# Patient Record
Sex: Male | Born: 1960 | Race: Black or African American | Hispanic: No | Marital: Single | Smoking: Never smoker
Health system: Southern US, Community
[De-identification: ages and names within clinical notes are randomized; demographics above are authoritative.]

## PROBLEM LIST (undated history)

## (undated) DIAGNOSIS — R569 Unspecified convulsions: Secondary | ICD-10-CM

## (undated) DIAGNOSIS — I10 Essential (primary) hypertension: Secondary | ICD-10-CM

## (undated) DIAGNOSIS — F1010 Alcohol abuse, uncomplicated: Secondary | ICD-10-CM

## (undated) DIAGNOSIS — E7800 Pure hypercholesterolemia, unspecified: Secondary | ICD-10-CM

## (undated) DIAGNOSIS — E119 Type 2 diabetes mellitus without complications: Secondary | ICD-10-CM

## (undated) HISTORY — PX: CHOLECYSTECTOMY: SHX55

---

## 2000-06-25 ENCOUNTER — Emergency Department (HOSPITAL_COMMUNITY): Admission: EM | Admit: 2000-06-25 | Discharge: 2000-06-25 | Payer: Self-pay | Admitting: Emergency Medicine

## 2010-04-17 ENCOUNTER — Emergency Department (HOSPITAL_BASED_OUTPATIENT_CLINIC_OR_DEPARTMENT_OTHER): Admission: EM | Admit: 2010-04-17 | Discharge: 2010-04-18 | Payer: Self-pay | Admitting: Emergency Medicine

## 2010-11-07 LAB — BASIC METABOLIC PANEL
BUN: 5 mg/dL — ABNORMAL LOW (ref 6–23)
CO2: 32 mEq/L (ref 19–32)
Calcium: 9 mg/dL (ref 8.4–10.5)
Chloride: 104 mEq/L (ref 96–112)
Chloride: 98 mEq/L (ref 96–112)
Creatinine, Ser: 0.8 mg/dL (ref 0.4–1.5)
Creatinine, Ser: 0.9 mg/dL (ref 0.4–1.5)
GFR calc Af Amer: >60 mL/min (ref >60)
GFR calc non Af Amer: >60 mL/min (ref >60)
Glucose, Bld: 428 mg/dL — ABNORMAL HIGH (ref 70–99)
Sodium: 136 mEq/L (ref 135–145)

## 2010-11-07 LAB — GLUCOSE, CAPILLARY
Glucose-Capillary: 171 mg/dL — ABNORMAL HIGH (ref 70–99)
Glucose-Capillary: 295 mg/dL — ABNORMAL HIGH (ref 70–99)
Glucose-Capillary: 392 mg/dL — ABNORMAL HIGH (ref 70–99)
Glucose-Capillary: 482 mg/dL — ABNORMAL HIGH (ref 70–99)

## 2012-03-23 ENCOUNTER — Emergency Department (HOSPITAL_COMMUNITY)
Admission: EM | Admit: 2012-03-23 | Discharge: 2012-03-24 | Disposition: A | Discharge status: Other Acute Inpatient Hospital | Payer: Medicaid Other | Attending: Emergency Medicine | Admitting: Emergency Medicine

## 2012-03-23 ENCOUNTER — Encounter (HOSPITAL_COMMUNITY): Payer: Self-pay

## 2012-03-23 DIAGNOSIS — F1010 Alcohol abuse, uncomplicated: Secondary | ICD-10-CM | POA: Insufficient documentation

## 2012-03-23 DIAGNOSIS — I10 Essential (primary) hypertension: Secondary | ICD-10-CM | POA: Insufficient documentation

## 2012-03-23 DIAGNOSIS — F411 Generalized anxiety disorder: Secondary | ICD-10-CM | POA: Insufficient documentation

## 2012-03-23 HISTORY — DX: Essential (primary) hypertension: I10

## 2012-03-23 HISTORY — DX: Alcohol abuse, uncomplicated: F10.10

## 2012-03-23 HISTORY — DX: Unspecified convulsions: R56.9

## 2012-03-23 HISTORY — DX: Pure hypercholesterolemia, unspecified: E78.00

## 2012-03-23 LAB — COMPREHENSIVE METABOLIC PANEL
ALT: 20 U/L (ref 0–53)
AST: 45 U/L — ABNORMAL HIGH (ref 0–37)
Albumin: 3.3 g/dL — ABNORMAL LOW (ref 3.5–5.2)
Alkaline Phosphatase: 148 U/L — ABNORMAL HIGH (ref 39–117)
CO2: 26 mEq/L (ref 19–32)
Chloride: 100 mEq/L (ref 96–112)
GFR calc non Af Amer: >90 mL/min (ref >90)
Potassium: 3.7 mEq/L (ref 3.5–5.1)
Sodium: 135 mEq/L (ref 135–145)
Total Bilirubin: 0.2 mg/dL — ABNORMAL LOW (ref 0.3–1.2)

## 2012-03-23 LAB — CBC
MCV: 94.4 fL (ref 78.0–100.0)
Platelets: 196 10*3/uL (ref 150–400)
RBC: 3.21 MIL/uL — ABNORMAL LOW (ref 4.22–5.81)
RDW: 16.6 % — ABNORMAL HIGH (ref 11.5–15.5)
WBC: 6.9 10*3/uL (ref 4.0–10.5)

## 2012-03-23 LAB — RAPID URINE DRUG SCREEN, HOSP PERFORMED
Amphetamines: NOT DETECTED
Barbiturates: POSITIVE — AB
Tetrahydrocannabinol: NOT DETECTED

## 2012-03-23 LAB — GLUCOSE, CAPILLARY: Glucose-Capillary: 172 mg/dL — ABNORMAL HIGH (ref 70–99)

## 2012-03-23 MED ORDER — METFORMIN HCL 500 MG PO TABS
1000.0000 mg | ORAL_TABLET | Freq: Every day | ORAL | Status: DC
  Administered 2012-03-23 – 2012-03-24 (×2): 1000 mg via ORAL
  Filled 2012-03-23 (×2): qty 2

## 2012-03-23 MED ORDER — LISINOPRIL 5 MG PO TABS
5.0000 mg | ORAL_TABLET | Freq: Every day | ORAL | Status: DC
  Administered 2012-03-23 – 2012-03-24 (×2): 5 mg via ORAL
  Filled 2012-03-23 (×2): qty 1

## 2012-03-23 MED ORDER — LORAZEPAM 2 MG/ML IJ SOLN: 1.0000 mg | Freq: Four times a day (QID) | INTRAMUSCULAR | Status: DC | PRN

## 2012-03-23 MED ORDER — FOLIC ACID 1 MG PO TABS
1.0000 mg | ORAL_TABLET | Freq: Every day | ORAL | Status: DC
  Administered 2012-03-23 – 2012-03-24 (×2): 1 mg via ORAL
  Filled 2012-03-23 (×2): qty 1

## 2012-03-23 MED ORDER — METFORMIN HCL 500 MG PO TABS
500.0000 mg | ORAL_TABLET | Freq: Every day | ORAL | Status: DC
  Administered 2012-03-24: 500 mg via ORAL
  Filled 2012-03-23 (×2): qty 1

## 2012-03-23 MED ORDER — CARVEDILOL 3.125 MG PO TABS
3.1250 mg | ORAL_TABLET | Freq: Two times a day (BID) | ORAL | Status: DC
  Administered 2012-03-23 – 2012-03-24 (×3): 3.125 mg via ORAL
  Filled 2012-03-23 (×4): qty 1

## 2012-03-23 MED ORDER — NICOTINE 21 MG/24HR TD PT24: 21.0000 mg | MEDICATED_PATCH | Freq: Every day | TRANSDERMAL | Status: DC

## 2012-03-23 MED ORDER — ADULT MULTIVITAMIN W/MINERALS CH
1.0000 | ORAL_TABLET | Freq: Every day | ORAL | Status: DC
  Administered 2012-03-23 – 2012-03-24 (×2): 1 via ORAL
  Filled 2012-03-23 (×2): qty 1

## 2012-03-23 MED ORDER — LORAZEPAM 1 MG PO TABS
1.0000 mg | ORAL_TABLET | Freq: Four times a day (QID) | ORAL | Status: DC | PRN
  Filled 2012-03-23: qty 2

## 2012-03-23 MED ORDER — GLIPIZIDE ER 5 MG PO TB24
5.0000 mg | ORAL_TABLET | Freq: Every day | ORAL | Status: DC
  Administered 2012-03-23 – 2012-03-24 (×2): 5 mg via ORAL
  Filled 2012-03-23 (×2): qty 1

## 2012-03-23 MED ORDER — HYDROXYZINE HCL 25 MG PO TABS: 25.0000 mg | ORAL_TABLET | Freq: Every evening | ORAL | Status: DC | PRN

## 2012-03-23 MED ORDER — VITAMIN B-1 100 MG PO TABS
100.0000 mg | ORAL_TABLET | Freq: Every day | ORAL | Status: DC
  Administered 2012-03-23 – 2012-03-24 (×2): 100 mg via ORAL
  Filled 2012-03-23 (×2): qty 1

## 2012-03-23 MED ORDER — THIAMINE HCL 100 MG/ML IJ SOLN: 100.0000 mg | Freq: Every day | INTRAMUSCULAR | Status: DC

## 2012-03-23 MED ORDER — METFORMIN HCL 500 MG PO TABS: 500.0000 mg | ORAL_TABLET | Freq: Two times a day (BID) | ORAL | Status: DC

## 2012-03-23 MED ORDER — LORAZEPAM 1 MG PO TABS
0.0000 mg | ORAL_TABLET | Freq: Two times a day (BID) | ORAL | Status: DC
  Administered 2012-03-24: 1 mg via ORAL

## 2012-03-23 MED ORDER — LORAZEPAM 1 MG PO TABS
2.0000 mg | ORAL_TABLET | Freq: Once | ORAL | Status: AC
  Administered 2012-03-23: 2 mg via ORAL

## 2012-03-23 MED ORDER — PHENYTOIN SODIUM EXTENDED 100 MG PO CAPS
200.0000 mg | ORAL_CAPSULE | Freq: Two times a day (BID) | ORAL | Status: DC
  Administered 2012-03-23 – 2012-03-24 (×3): 200 mg via ORAL
  Filled 2012-03-23 (×3): qty 2

## 2012-03-23 MED ORDER — ALUM & MAG HYDROXIDE-SIMETH 200-200-20 MG/5ML PO SUSP: 30.0000 mL | ORAL | Status: DC | PRN

## 2012-03-23 MED ORDER — FLUOXETINE HCL 20 MG PO CAPS
20.0000 mg | ORAL_CAPSULE | Freq: Every day | ORAL | Status: DC
  Administered 2012-03-23 – 2012-03-24 (×2): 20 mg via ORAL
  Filled 2012-03-23 (×3): qty 1

## 2012-03-23 MED ORDER — IBUPROFEN 600 MG PO TABS
600.0000 mg | ORAL_TABLET | Freq: Three times a day (TID) | ORAL | Status: DC | PRN
  Administered 2012-03-24: 600 mg via ORAL
  Filled 2012-03-23: qty 1

## 2012-03-23 MED ORDER — LORAZEPAM 1 MG PO TABS
0.0000 mg | ORAL_TABLET | Freq: Four times a day (QID) | ORAL | Status: DC
  Administered 2012-03-24: 1 mg via ORAL
  Filled 2012-03-23: qty 1

## 2012-03-23 MED ORDER — ONDANSETRON HCL 4 MG PO TABS: 4.0000 mg | ORAL_TABLET | Freq: Three times a day (TID) | ORAL | Status: DC | PRN

## 2012-03-23 NOTE — ED Notes (Signed)
Pt states he was at [facility] last week and was then sent to [facility] for alcohol tx.  After being discharged from [facility] he went back home to drink.  He returned to [facility] today for more alcohol treatment but was told to come to [place] for med clearance.  Pt denies SI/HI.

## 2012-03-23 NOTE — BH Assessment (Signed)
Assessment Note   Jerry Small is an 51 y.o. male. Pt reported to the [facility] with a chief complaint of alcohol dependence, requesting detox. Pt reports that he has been drinking alcohol since the age of 5 with his longest period of sobriety being 80yr in 35. Pt states that he drinks a 6-pack of beer daily for the last 7-8 months, with his last use on 03/22/12 ( amt= 6-pack of beer).  Pt reports that his current withdrawal symptoms include: shaking, anxiety, sweating, hot/cold chills and headaches. Pt reports that approximately 2 weeks ago he completed detox at [facility] and had an appointment scheduled at [facility] the following week. Pt states that he was unable to remain sober to make it to his appointment at [facility]. Pt shared that he needs help with alcohol detox and wants to "get off alcohol for good so that he can get a job and a stable place to live". Pt states that he is currently homeless and is staying with a friend. Pt has a hx of seizures, reporting that his last seizure was 3 weeks ago. Pt reports that he has a current prescription for his seizure medications and has a 90 day supply. Pt has no insurance and no Medicaid pending.  Pt denies all SI/HI/AVH and current depression.   Axis I: Alcohol Dependence Axis II: Deferred Axis III:  Past Medical History  Diagnosis Date  . Alcohol abuse   . Seizure   . Hypertension   . High cholesterol    Axis IV: housing problems, occupational problems and other psychosocial or environmental problems Axis V: 41-50 serious symptoms  Past Medical History:  Past Medical History  Diagnosis Date  . Alcohol abuse   . Seizure   . Hypertension   . High cholesterol     Past Surgical History  Procedure Date  . Cholecystectomy     Family History:  Family History  Problem Relation Age of Onset  . Hypertension Father   . Seizures Father     Social History:  reports that he has never smoked. He has never used smokeless tobacco. He  reports that he drinks alcohol. He reports that he does not use illicit drugs.  Additional Social History:  Alcohol / Drug Use History of alcohol / drug use?: Yes Substance #1 Name of Substance 1: Alcohol  1 - Age of First Use: 14 1 - Amount (size/oz): 6-pack beer  1 - Frequency: daily 1 - Duration: 7-8 months 1 - Last Use / Amount: 03/22/12- 6-pack of beer  CIWA: CIWA-Ar BP: 184/97 mmHg Pulse Rate: 67  Nausea and Vomiting: no nausea and no vomiting Tactile Disturbances: none Tremor: no tremor Auditory Disturbances: not present Paroxysmal Sweats: no sweat visible Visual Disturbances: not present Anxiety: no anxiety, at ease Headache, Fullness in Head: none present Agitation: normal activity Orientation and Clouding of Sensorium: oriented and can do serial additions CIWA-Ar Total: 0  COWS:    Allergies: No Known Allergies  Home Medications:  (Not in a hospital admission)  OB/GYN Status:  No LMP for male patient.  General Assessment Data Location of Assessment: [facility] Living Arrangements: Non-relatives/Friends (pt is homeless and currently staying with a friend) Can pt return to current living arrangement?: Yes Admission Status: Voluntary Is patient capable of signing voluntary admission?: Yes Transfer from: Acute Hospital Referral Source: Self/Family/Friend  Education Status Is patient currently in school?: No  Risk to self Suicidal Ideation: No Suicidal Intent: No Is patient at risk for suicide?: No  Suicidal Plan?: No Access to Means: No What has been your use of drugs/alcohol within the last 12 months?: ETOH: 6pack of beer daily Previous Attempts/Gestures: No How many times?: 0  Other Self Harm Risks: none Triggers for Past Attempts: None known Intentional Self Injurious Behavior: None Family Suicide History: No Recent stressful life event(s): Other (Comment) (unemployed and homeless) Persecutory voices/beliefs?: No Depression: No Depression Symptoms:   (negative for all symptoms) Substance abuse history and/or treatment for substance abuse?: Yes Suicide prevention information given to non-admitted patients: Not applicable  Risk to Others Homicidal Ideation: No Thoughts of Harm to Others: No Current Homicidal Intent: No Current Homicidal Plan: No Access to Homicidal Means: No Identified Victim: none History of harm to others?: No Assessment of Violence: None Noted Violent Behavior Description: pt is calm and cooperative during assessment Does patient have access to weapons?: No Criminal Charges Pending?: No Does patient have a court date: No  Psychosis Hallucinations: None noted Delusions: None noted  Mental Status Report Appear/Hygiene: Other (Comment) (appropriate to circumstances) Eye Contact: Fair Motor Activity: Freedom of movement Speech: Logical/coherent Level of Consciousness: Alert Mood: Other (Comment) (appropriate to circumstances) Affect: Appropriate to circumstance Anxiety Level: None Thought Processes: Coherent;Relevant Judgement: Unimpaired Orientation: Person;Place;Time;Situation Obsessive Compulsive Thoughts/Behaviors: None  Cognitive Functioning Concentration: Normal Memory: Recent Intact;Remote Intact IQ: Average Insight: Fair Impulse Control: Fair Appetite: Good Weight Loss: 0  Weight Gain: 0  Sleep: Decreased Total Hours of Sleep: 4  (last 2 nights) Vegetative Symptoms: None  ADLScreening [facility] Assessment Services) Patient's cognitive ability adequate to safely complete daily activities?: Yes Patient able to express need for assistance with ADLs?: Yes Independently performs ADLs?: Yes  Abuse/Neglect [facility]) Physical Abuse: Denies Verbal Abuse: Denies Sexual Abuse: Denies  Prior Inpatient Therapy Prior Inpatient Therapy: Yes Prior Therapy Dates: 02/2012 Prior Therapy Facilty/Provider(s): [facility]  Reason for Treatment: detox  Prior Outpatient Therapy Prior Outpatient  Therapy: Yes Prior Therapy Dates: currently Prior Therapy Facilty/Provider(s): [facility] Reason for Treatment: depression  ADL Screening (condition at time of admission) Patient's cognitive ability adequate to safely complete daily activities?: Yes Patient able to express need for assistance with ADLs?: Yes Independently performs ADLs?: Yes       Abuse/Neglect Assessment (Assessment to be complete while patient is alone) Physical Abuse: Denies Verbal Abuse: Denies Sexual Abuse: Denies Values / Beliefs Cultural Requests During Hospitalization: None Spiritual Requests During Hospitalization: None        Additional Information 1:1 In Past 12 Months?: No CIRT Risk: No Elopement Risk: No Does patient have medical clearance?: Yes     Disposition:  Disposition Disposition of Patient: Referred to [facility]) Patient referred to: [facility]  On Site Evaluation by:   Reviewed with Physician:     Dell Ponto 03/23/2012 6:28 PM

## 2012-03-23 NOTE — ED Notes (Signed)
SW reports that [facility] is requesting a Dilantin level before acceptance. [facility] is requesting a diagnosis confirmation from an RN on the reason pt is taking Metformin and two consecutive BP's that are "lower". Dilantin level ordered and present BP obtained at 154/81, which was reported back to the SW with the confirmation that the pt is, indeed, a diabetic.

## 2012-03-23 NOTE — ED Notes (Signed)
Lab called this RN and stated that they did not have any blood for a Phenytoin level for this patient, and that the wrong type of Phenytoin level had been ordered. Lab tech stated that a Phenytoin (free) lab was not performed at this hospital. Order changed to Phenytoin (total) and Lonell Grandchild obtained sample and send specimen to the lab. Order was acknowledged by A. Allred in mini-lab but collected by M.Coker, NT in the Psych ED.

## 2012-03-23 NOTE — ED Notes (Signed)
Patient is requesting detox from alcohol. Patient normally drinks a six-pack daily. Patient states he last drank yesterday and had 2 40-0unce beers. Patient denies SI/HI. Patient reports that he had 2 seizures last week when he was not drinking alcohol.

## 2012-03-23 NOTE — Progress Notes (Signed)
CSW met with pt to verify that he has a 14 day supply of all of his medications. Pt states that he is "missing at least one of his medications" and is not sure which one. At this time, pt will not be eligible to go to [facility] without all his medications.   CSW notified Alice at [facility] that pt's updated labs are ready for review. Pt is currently pending [facility].

## 2012-03-23 NOTE — ED Notes (Signed)
Pt attended [facility] ED group facilitated by Wilkie Aye, MDiv.  After introductions and group rules, group members shared their present feelings with one another and decided on a common theme of desperation (feeling against the wall) and motivation (keeping going). Members described losses associated with desperation, and warning signs that allowed them to recognize when they are reaching their "limit."  Members discussed areas in their lives where they see motivation, including present goals and one strategy they go to when they are feeling as though they are overwhelmed and cannot change.   After introductions, pt expressed to group desire to detox from ETOH.  Pt described previous experience at [facility].  Spoke with group about grief over losses associated with ETOH.  Described his girlfriend being source of inspiration for him when he feels overwhelmed.   Was appropriately engaged with group and attentive to stories of other members.    Belva Crome  MDiv, Chaplain

## 2012-03-23 NOTE — ED Provider Notes (Signed)
History     CSN: [id number]  Arrival date & time 03/23/12  1478   First MD Initiated Contact with Patient 03/23/12 1022      Chief Complaint  Patient presents with  . Medical Clearance  . Alcohol Problem    (Consider location/radiation/quality/duration/timing/severity/associated sxs/prior treatment) Patient is a 51 y.o. male presenting with alcohol problem. The history is provided by the patient.  Alcohol Problem This is a chronic problem.  Reports heavy alcohol use since the age of 44. Has been trying to detox over the last 2 weeks. Has spent time at [facility], but relapsed after departure. Has had alcohol withdrawal seizures last week, which apparently precluded his acceptance at [facility] today and he was advised to present to ED. Denies seizure activity today, but does complain of anxiety and a HA. Denies head injury. No fever, visual change, tinnitus, neck stiffness, extremity weakness/numbness, trouble ambulating, trouble speaking. No worst HA of life. No N/V, abd pain. Denies SI, HI, hallucinations. Denies recreational drug use. No aggravating or alleviating factors for his symptoms.  Past Medical History  Diagnosis Date  . Alcohol abuse   . Seizure   . Hypertension   . High cholesterol     Past Surgical History  Procedure Date  . Cholecystectomy     Family History  Problem Relation Age of Onset  . Hypertension Father   . Seizures Father     History  Substance Use Topics  . Smoking status: Never Smoker   . Smokeless tobacco: Never Used  . Alcohol Use: Yes     6 pack daily      Review of Systems 10 systems reviewed and are negative for acute change except as noted in the HPI.  Allergies  Review of patient's allergies indicates no known allergies.  Home Medications   Current Outpatient Rx  Name Route Sig Dispense Refill  . CARVEDILOL 3.125 MG PO TABS Oral Take 3.125 mg by mouth 2 (two) times daily with a meal.    . FLUOXETINE HCL 20 MG PO CAPS  Oral Take 20 mg by mouth daily.    Marland Kitchen GLIPIZIDE ER 5 MG PO TB24 Oral Take 5 mg by mouth daily.    Marland Kitchen HYDROXYZINE HCL 25 MG PO TABS Oral Take 25 mg by mouth at bedtime as needed. For sleep.    Marland Kitchen METFORMIN HCL 500 MG PO TABS Oral Take 500-1,000 mg by mouth 2 (two) times daily with a meal. 1 tab in am, 2 in pm    . PHENYTOIN SODIUM EXTENDED 100 MG PO CAPS Oral Take 200 mg by mouth 2 (two) times daily.    . QUINAPRIL HCL 5 MG PO TABS Oral Take 5 mg by mouth daily.      BP 182/88  Pulse 61  Temp 98.8 F (37.1 C) (Oral)  Resp 18  Ht 5\' 4"  (1.626 m)  Wt 135 lb (61.236 kg)  BMI 23.17 kg/m2  SpO2 98%  Physical Exam  Nursing note reviewed. Constitutional: He is oriented to person, place, and time. He appears well-developed and well-nourished. No distress.       VS reviewed, sig for HTN.  HENT:  Head: Normocephalic and atraumatic.  Right Ear: External ear normal.  Left Ear: External ear normal.       MMM  Eyes: Conjunctivae are normal. Pupils are equal, round, and reactive to light.  Neck: Neck supple.  Cardiovascular: Normal rate and regular rhythm.   Pulmonary/Chest: Effort normal. No respiratory distress.  Abdominal: Soft. He exhibits no distension. There is no tenderness.  Musculoskeletal: He exhibits no edema.  Neurological: He is alert and oriented to person, place, and time. No cranial nerve deficit (3-12 intact).       Steady gait. Very slight tremor noted. Speech clear and appropriate.  Skin: Skin is dry.  Psychiatric:       Anxious-appearing    ED Course  Procedures (including critical care time)   Labs Reviewed  CBC  COMPREHENSIVE METABOLIC PANEL  ETHANOL  URINE RAPID DRUG SCREEN (HOSP PERFORMED)   No results found.   Dx 1: Alcohol abuse   MDM  10:35 AM Pt seen and evaluated. Medical clearance/alcohol detox assistance. No HI, SI, signs of psychosis. Mild tremor- 2mg  PO ativan ordered. Will place psych holding orders and CIWA protocol orders with request to move  to psych ED.        Shaaron Adler, [place] 03/23/12 2009

## 2012-03-24 ENCOUNTER — Inpatient Hospital Stay (HOSPITAL_COMMUNITY)
Admission: AD | Admit: 2012-03-24 | Discharge: 2012-03-29 | DRG: 897 | Disposition: A | Discharge status: Home / Self Care | Payer: Federal, State, Local not specified - Other | Source: Ambulatory Visit | Attending: Psychiatry | Admitting: Psychiatry

## 2012-03-24 ENCOUNTER — Encounter (HOSPITAL_COMMUNITY): Payer: Self-pay | Admitting: Emergency Medicine

## 2012-03-24 DIAGNOSIS — E119 Type 2 diabetes mellitus without complications: Secondary | ICD-10-CM | POA: Diagnosis present

## 2012-03-24 DIAGNOSIS — F10239 Alcohol dependence with withdrawal, unspecified: Principal | ICD-10-CM | POA: Diagnosis present

## 2012-03-24 DIAGNOSIS — Z79899 Other long term (current) drug therapy: Secondary | ICD-10-CM

## 2012-03-24 DIAGNOSIS — I10 Essential (primary) hypertension: Secondary | ICD-10-CM | POA: Diagnosis present

## 2012-03-24 DIAGNOSIS — F10939 Alcohol use, unspecified with withdrawal, unspecified: Principal | ICD-10-CM | POA: Diagnosis present

## 2012-03-24 DIAGNOSIS — F1020 Alcohol dependence, uncomplicated: Secondary | ICD-10-CM | POA: Diagnosis present

## 2012-03-24 DIAGNOSIS — E7800 Pure hypercholesterolemia, unspecified: Secondary | ICD-10-CM | POA: Diagnosis present

## 2012-03-24 MED ORDER — METFORMIN HCL 500 MG PO TABS
500.0000 mg | ORAL_TABLET | Freq: Every day | ORAL | Status: DC
  Administered 2012-03-25 – 2012-03-28 (×4): 500 mg via ORAL
  Filled 2012-03-24 (×6): qty 1

## 2012-03-24 MED ORDER — METFORMIN HCL 500 MG PO TABS: 500.0000 mg | ORAL_TABLET | Freq: Two times a day (BID) | ORAL | Status: DC

## 2012-03-24 MED ORDER — FLUOXETINE HCL 20 MG PO CAPS
20.0000 mg | ORAL_CAPSULE | Freq: Every day | ORAL | Status: DC
  Administered 2012-03-25 – 2012-03-28 (×4): 20 mg via ORAL
  Filled 2012-03-24 (×6): qty 1

## 2012-03-24 MED ORDER — METFORMIN HCL 500 MG PO TABS
1000.0000 mg | ORAL_TABLET | Freq: Every day | ORAL | Status: DC
  Administered 2012-03-25 – 2012-03-28 (×4): 1000 mg via ORAL
  Filled 2012-03-24 (×5): qty 2

## 2012-03-24 MED ORDER — LISINOPRIL 5 MG PO TABS
5.0000 mg | ORAL_TABLET | Freq: Every day | ORAL | Status: DC
  Administered 2012-03-25 – 2012-03-28 (×4): 5 mg via ORAL
  Filled 2012-03-24 (×6): qty 1

## 2012-03-24 MED ORDER — GLIPIZIDE ER 5 MG PO TB24
5.0000 mg | ORAL_TABLET | Freq: Every day | ORAL | Status: DC
  Administered 2012-03-25 – 2012-03-28 (×4): 5 mg via ORAL
  Filled 2012-03-24 (×7): qty 1

## 2012-03-24 MED ORDER — PHENYTOIN SODIUM EXTENDED 100 MG PO CAPS
200.0000 mg | ORAL_CAPSULE | Freq: Two times a day (BID) | ORAL | Status: DC
  Administered 2012-03-25 – 2012-03-28 (×9): 200 mg via ORAL
  Filled 2012-03-24 (×13): qty 2

## 2012-03-24 MED ORDER — CARVEDILOL 6.25 MG PO TABS
3.1250 mg | ORAL_TABLET | Freq: Two times a day (BID) | ORAL | Status: DC
  Administered 2012-03-25 – 2012-03-28 (×8): 3.125 mg via ORAL
  Filled 2012-03-24: qty 1
  Filled 2012-03-24: qty 14
  Filled 2012-03-24 (×7): qty 1
  Filled 2012-03-24: qty 14
  Filled 2012-03-24: qty 1

## 2012-03-24 NOTE — ED Notes (Signed)
Attempted to call report. No answer at nurse's station telephone. Will tray again shortly.

## 2012-03-24 NOTE — Tx Team (Signed)
Initial Interdisciplinary Treatment Plan  PATIENT STRENGTHS: (choose at least two) Ability for insight Active sense of humor Average or above average intelligence Capable of independent living Communication skills Motivation for treatment/growth Supportive family/friends  PATIENT STRESSORS: Medication change or noncompliance Substance abuse   PROBLEM LIST: Problem List/Patient Goals Date to be addressed Date deferred Reason deferred Estimated date of resolution  Alcohol Dependence 03/24/12     Depression 03/24/12                                                DISCHARGE CRITERIA:  Ability to meet basic life and health needs Improved stabilization in mood, thinking, and/or behavior Need for constant or close observation no longer present Safe-care adequate arrangements made Verbal commitment to aftercare and medication compliance  PRELIMINARY DISCHARGE PLAN: Attend aftercare/continuing care group Outpatient therapy Return to previous living arrangement  PATIENT/FAMIILY INVOLVEMENT: This treatment plan has been presented to and reviewed with the patient, Jerry Small, and/or family member, .  The patient and family have been given the opportunity to ask questions and make suggestions.  Jerry Small 03/24/2012, 9:09 PM

## 2012-03-24 NOTE — Progress Notes (Signed)
Patient ID: Jerry Small, male   DOB: 1961/08/17, 51 y.o.   MRN: [id number] Pt admitted to [facility] voluntarily for alcohol detox. Pt denies SI/HI or plans to harm himself. PtPt states he drinks a 6 pack daily but had 2 seizures last week because he did not drink. Pt also states he had not been taking his Dilantin at the time. Pt with hx of multiple admissions for detox and had been at [facility] prior to arrival. Pt pleasant and cooperative during admission process. Pt oriented to unit and will be monitored Q 15 minutes for safety.

## 2012-03-24 NOTE — BHH Counselor (Signed)
Per shift report, Pt was referred to [facility] by CSW. She met with pt to verify that he has a 14 day supply of all of his medications. Pt states that he is "missing at least one of his medications" and is not sure which one. At this time, pt will not be eligible to go to [facility] without all his medications. CSW notified Alice at [facility] that pt's updated labs are ready for review. Pt is currently pending [facility] 03/23/2012.  Patient continues to remain on the run log for a potential bed at [facility]. However, this writer has also contacted [facility] to see if their facility would be willing to accept patient into their facility for detox.   If patient is not accepted at [facility] or [facility] by this evening. Writer will meet with patient and discuss alternatives for substance abuse treatment. Patient's last drinking was 03/22/2012 and this evening will have been 3 days without alcohol. Patient's BAL is also <11, therefore; meets limited criteria at this time.   Disposition Pending [facility], [facility], and/or potential discharge home with the appropriate substance abuse follow up  (CD-IOP, support groups, individual substance abuse therapist, etc.).

## 2012-03-24 NOTE — ED Provider Notes (Signed)
Medical screening examination/treatment/procedure(s) were performed by non-physician practitioner and as supervising physician I was immediately available for consultation/collaboration.    Jaicob Dia R Paiden Caraveo, MD 03/24/12 1609 

## 2012-03-24 NOTE — ED Notes (Signed)
Pt. Showed this writer several dark spots on bilateral inner thighs range in size 1-2cm.  Pt. States that he showed this to the EDP this am and EDP told him that they looked like bed bug bites.  Pt. States no pain, no itching, "not bothering me".  Pt. Also informed RN that he was recently D/C'ed from [facility] 1 week ago today and that he had slept at home since then until coming here.  Educated pt. As to what to look for at home and how to treat matress if he did find bed bugs.  Pt. Verbalized understanding.

## 2012-03-24 NOTE — ED Provider Notes (Signed)
History     CSN: [id number]  Arrival date & time 03/23/12  2440   First MD Initiated Contact with Patient 03/23/12 1022      Chief Complaint  Patient presents with  . Medical Clearance  . Alcohol Problem    (Consider location/radiation/quality/duration/timing/severity/associated sxs/prior treatment) HPI  Past Medical History  Diagnosis Date  . Alcohol abuse   . Seizure   . Hypertension   . High cholesterol     Past Surgical History  Procedure Date  . Cholecystectomy     Family History  Problem Relation Age of Onset  . Hypertension Father   . Seizures Father     History  Substance Use Topics  . Smoking status: Never Smoker   . Smokeless tobacco: Never Used  . Alcohol Use: Yes     6 pack daily      Review of Systems  Allergies  Review of patient's allergies indicates no known allergies.  Home Medications   Current Outpatient Rx  Name Route Sig Dispense Refill  . CARVEDILOL 3.125 MG PO TABS Oral Take 3.125 mg by mouth 2 (two) times daily with a meal.    . FLUOXETINE HCL 20 MG PO CAPS Oral Take 20 mg by mouth daily.    Marland Kitchen GLIPIZIDE ER 5 MG PO TB24 Oral Take 5 mg by mouth daily.    Marland Kitchen HYDROXYZINE HCL 25 MG PO TABS Oral Take 25 mg by mouth at bedtime as needed. For sleep.    Marland Kitchen METFORMIN HCL 500 MG PO TABS Oral Take 500-1,000 mg by mouth 2 (two) times daily with a meal. 1 tab in am, 2 in pm    . PHENYTOIN SODIUM EXTENDED 100 MG PO CAPS Oral Take 200 mg by mouth 2 (two) times daily.    . QUINAPRIL HCL 5 MG PO TABS Oral Take 5 mg by mouth daily.      BP 166/93  Pulse 63  Temp 98.3 F (36.8 C) (Oral)  Resp 18  Ht 5\' 4"  (1.626 m)  Wt 135 lb (61.236 kg)  BMI 23.17 kg/m2  SpO2 100%  Physical Exam  ED Course  Procedures (including critical care time)  Labs Reviewed  CBC - Abnormal; Notable for the following:    RBC 3.21 (*)     Hemoglobin 10.3 (*)     HCT 30.3 (*)     RDW 16.6 (*)     All other components within normal limits  COMPREHENSIVE  METABOLIC PANEL - Abnormal; Notable for the following:    Glucose, Bld 177 (*)     Albumin 3.3 (*)     AST 45 (*)     Alkaline Phosphatase 148 (*)     Total Bilirubin 0.2 (*)     All other components within normal limits  URINE RAPID DRUG SCREEN (HOSP PERFORMED) - Abnormal; Notable for the following:    Benzodiazepines POSITIVE (*)     Barbiturates POSITIVE (*)     All other components within normal limits  PHENYTOIN LEVEL, TOTAL - Abnormal; Notable for the following:    Phenytoin Lvl 3.3 (*)     All other components within normal limits  GLUCOSE, CAPILLARY - Abnormal; Notable for the following:    Glucose-Capillary 172 (*)     All other components within normal limits  ETHANOL   No results found.   1. Alcohol abuse       MDM  Pt awake, interactive - NAD.  Pt presented for detox placement/substance abuse assistance.  Reviewed VS, labs, meds, nursing notes.  Pt is awaiting assessment for placement.  Will continue to follow closely.        Tobin Chad, MD 03/24/12 [phone]

## 2012-03-24 NOTE — ED Notes (Signed)
Report called to Designer, multimedia at [facility]. Pt verbalizes understanding of transfer to [facility] and consents.

## 2012-03-25 DIAGNOSIS — F10239 Alcohol dependence with withdrawal, unspecified: Principal | ICD-10-CM

## 2012-03-25 DIAGNOSIS — F1020 Alcohol dependence, uncomplicated: Secondary | ICD-10-CM | POA: Diagnosis present

## 2012-03-25 MED ORDER — HYDROXYZINE HCL 25 MG PO TABS: 25.0000 mg | ORAL_TABLET | Freq: Four times a day (QID) | ORAL | Status: AC | PRN

## 2012-03-25 MED ORDER — THIAMINE HCL 100 MG/ML IJ SOLN
100.0000 mg | Freq: Once | INTRAMUSCULAR | Status: AC
  Administered 2012-03-25: 100 mg via INTRAMUSCULAR

## 2012-03-25 MED ORDER — ONDANSETRON 4 MG PO TBDP
4.0000 mg | ORAL_TABLET | Freq: Four times a day (QID) | ORAL | Status: AC | PRN
Start: 2012-03-25 — End: 2012-03-28

## 2012-03-25 MED ORDER — MAGNESIUM HYDROXIDE 400 MG/5ML PO SUSP: 30.0000 mL | Freq: Every day | ORAL | Status: DC | PRN

## 2012-03-25 MED ORDER — LOPERAMIDE HCL 2 MG PO CAPS: 2.0000 mg | ORAL_CAPSULE | ORAL | Status: AC | PRN

## 2012-03-25 MED ORDER — ALUM & MAG HYDROXIDE-SIMETH 200-200-20 MG/5ML PO SUSP: 30.0000 mL | ORAL | Status: DC | PRN

## 2012-03-25 MED ORDER — ADULT MULTIVITAMIN W/MINERALS CH
1.0000 | ORAL_TABLET | Freq: Every day | ORAL | Status: DC
  Administered 2012-03-25 – 2012-03-28 (×4): 1 via ORAL
  Filled 2012-03-25 (×6): qty 1

## 2012-03-25 MED ORDER — CHLORDIAZEPOXIDE HCL 25 MG PO CAPS
25.0000 mg | ORAL_CAPSULE | Freq: Four times a day (QID) | ORAL | Status: AC | PRN
  Administered 2012-03-27: 25 mg via ORAL

## 2012-03-25 MED ORDER — CHLORDIAZEPOXIDE HCL 25 MG PO CAPS
50.0000 mg | ORAL_CAPSULE | Freq: Once | ORAL | Status: AC
  Administered 2012-03-25: 50 mg via ORAL
  Filled 2012-03-25 (×2): qty 1

## 2012-03-25 MED ORDER — VITAMIN B-1 100 MG PO TABS
100.0000 mg | ORAL_TABLET | Freq: Every day | ORAL | Status: DC
  Administered 2012-03-26 – 2012-03-28 (×3): 100 mg via ORAL
  Filled 2012-03-25 (×5): qty 1

## 2012-03-25 MED ORDER — CHLORDIAZEPOXIDE HCL 25 MG PO CAPS
25.0000 mg | ORAL_CAPSULE | ORAL | Status: AC
  Administered 2012-03-27 – 2012-03-28 (×2): 25 mg via ORAL
  Filled 2012-03-25 (×2): qty 1

## 2012-03-25 MED ORDER — ACETAMINOPHEN 325 MG PO TABS: 650.0000 mg | ORAL_TABLET | Freq: Four times a day (QID) | ORAL | Status: DC | PRN

## 2012-03-25 MED ORDER — HYDROXYZINE HCL 50 MG PO TABS
50.0000 mg | ORAL_TABLET | Freq: Every evening | ORAL | Status: DC | PRN
  Administered 2012-03-25: 50 mg via ORAL

## 2012-03-25 MED ORDER — CHLORDIAZEPOXIDE HCL 25 MG PO CAPS: 25.0000 mg | ORAL_CAPSULE | Freq: Every day | ORAL | Status: DC

## 2012-03-25 MED ORDER — CHLORDIAZEPOXIDE HCL 25 MG PO CAPS
25.0000 mg | ORAL_CAPSULE | Freq: Three times a day (TID) | ORAL | Status: AC
  Administered 2012-03-26 – 2012-03-27 (×3): 25 mg via ORAL
  Filled 2012-03-25 (×2): qty 1

## 2012-03-25 MED ORDER — CHLORDIAZEPOXIDE HCL 25 MG PO CAPS
25.0000 mg | ORAL_CAPSULE | Freq: Four times a day (QID) | ORAL | Status: AC
  Administered 2012-03-25 – 2012-03-26 (×5): 25 mg via ORAL
  Filled 2012-03-25 (×6): qty 1

## 2012-03-25 NOTE — [facility]HH Suicide Risk Assessment (Signed)
Suicide Risk Assessment  Admission Assessment      Demographic factors:  See chart.  Current Mental Status: Patient seen and evaluated. Chart reviewed. Patient stated that his mood was "ok". His affect was mood congruent and euthymic. He denied any current thoughts of self injurious behavior, suicidal ideation or homicidal ideation. He denied any significant depressive signs or symptoms at this time. There were no auditory or visual hallucinations, paranoia, delusional thought processes, or mania noted.  Thought process was linear and goal directed.  No psychomotor agitation or retardation was noted. His speech was normal rate, tone and volume. Eye contact was good. Judgment and insight are fair.  Patient has been up and engaged on the unit.  No acute safety concerns reported from team.  Loss Factors: denied.  Historical Factors: Family history of mental illness or substance abuse; denied hx SIB/SI/attempts/plans  Risk Reduction Factors: Sense of responsibility to family;Living with another person, especially a relative; open to [facility] residential Tx s/p detox  CLINICAL FACTORS: Alcohol Use & W/D Disorder; Seizure Disorder; HTN   COGNITIVE FEATURES THAT CONTRIBUTE TO RISK: limited insight.  SUICIDE RISK: Patient is currently viewed as a low risk of harm to himself and others in light of his history and risk factors. There are no acute safety concerns.   PLAN OF CARE: Pt admitted for crisis stabilization, detox off alcohol with standard librium taper and treatment.  Please see orders.  Medications reviewed with pt and medication education provided.  Restarted outpt meds. Will continue q15 minute checks per unit protocol.  No clinical indication for one on one level of observation at this time.  Pt contracting for safety.  Mental health treatment, medication management and continued sobriety will mitigate against the potential increased risk of harm to self and/or others.  Discussed the  importance of recovery with pt, as well as, tools to move forward in a healthy & safe manner.  Pt agreeable with the plan.  Discussed with the team.   Jerry Small 03/25/2012, 2:03 PM

## 2012-03-25 NOTE — [facility] Counselor (Signed)
Adult Comprehensive Assessment  Patient ID: Jerry Small, male   DOB: [birth year], 50 y.o.   MRN: [id number]  Information Source: Information source: Patient  Current Stressors:  Educational / Learning stressors: NA Employment / Job issues: Unemployed Family Relationships: NA Surveyor, quantity / Lack of resources (include bankruptcy): [employer] / Lack of housing: Homeless Physical health (include injuries & life threatening diseases): 2 seizures 3 weeks ago Social relationships: Most friends use Substance abuse: Ongoing Bereavement / Loss: NA  Living/Environment/Situation:  Living Arrangements: Non-relatives/Friends Living conditions (as described by patient or guardian): Patient reports he stays on and off with friends and other times sleeps in friends' trucks or cars How long has patient lived in current situation?: 3 years What is atmosphere in current home: Chaotic;Temporary  Family History:  Marital status: Single Does patient have children?: No  Childhood History:  By whom was/is the patient raised?: Other (Comment) Database administrator and Mom) Additional childhood history information: No relationship with Father until patient was 72 YO Description of patient's relationship with caregiver when they were a child: Good with  GM, okay w Mom Patient's description of current relationship with people who raised him/her: GM, M & F all deceased Does patient have siblings?: Yes Number of Siblings: 3  Description of patient's current relationship with siblings: Good with all Did patient suffer any verbal/emotional/physical/sexual abuse as a child?: Yes (Emotional - Mother on the streets a lot; pt in charge of siblings) Did patient suffer from severe childhood neglect?: No (Pt reports GM was there also yet she had liquor house) Has patient ever been sexually abused/assaulted/raped as an adolescent or adult?: No Was the patient ever a victim of a crime or a disaster?: No Witnessed domestic  violence?: No Has patient been effected by domestic violence as an adult?: No  Education:  Highest grade of school patient has completed: 12 Currently a Consulting civil engineer?: No Learning disability?: No  Employment/Work Situation:   Employment situation: Unemployed (9 years) What is the longest time patient has a held a job?: 12 years Where was the patient employed at that time?: [employer] in [place]  Has patient ever been in the [employer]?: No Has patient ever served in Buyer, retail?: No  Financial Resources:   Surveyor, quantity resources: No income ("Picks up cans and stuff")  Alcohol/Substance Abuse:   What has been your use of drugs/alcohol within the last 12 months?: 6 pack daily for last 8 months If attempted suicide, did drugs/alcohol play a role in this?:  (No attempt) Alcohol/Substance Abuse Treatment Hx: Past detox If yes, describe treatment: [facility] early July 2013 Has alcohol/substance abuse ever caused legal problems?: No  Social Support System:   Conservation officer, nature Support System: Fair Describe Community Support System: Sister, Jerry Small and Uncle Type of faith/religion: Mainly Baptist How does patient's faith help to cope with current illness?: Nurse, adult:   Leisure and Hobbies: Walking  Strengths/Needs:   What things does the patient do well?: Trying not to worry In what areas does patient struggle / problems for patient: Staying clean off alcohol and struggling to get by  Discharge Plan:   Does patient have access to transportation?: Yes Will patient be returning to same living situation after discharge?: No Plan for living situation after discharge: Hopes to stay with a friend and friend will allow IF patient gets sober Currently receiving community mental health services: Yes (From Whom) Museum/gallery curator in [place]) Does patient have financial barriers related to discharge medications?: Yes Patient description  of barriers related to  discharge medications: no income  Summary/Recommendations:   Summary and Recommendations (to be completed by the evaluator): Patient is 51 YO single unemployed homeless African American admitted with diagnosis of Alcohol Dependence.  Patient will benefit from crisis stabilization, medication evaluation, group therapy and psycho education in addition to discharge planning.   Jerry Small. 03/25/2012

## 2012-03-25 NOTE — Care Management (Signed)
Patient presented to [facility] and was sent to [facility] for detox. Patient states that Richelle Ito From [facility] stated that he could return once detoxed. Patient rents a room and can return to live there if he is ETOH free and "gets a job". Patient would benefit from a Vocational Rehab referral once treatment is completed. Patient is a sponsorship and will need a supply of meds at discharge. He may be able to get a ride to [facility] once discharged. Suicide Risk Information reviewed with patient. Joice Lofts RN MS EdS 03/25/2012  9:47 AM

## 2012-03-25 NOTE — Progress Notes (Signed)
[facility] Adult Inpatient Family/Significant Other Suicide Prevention Education  Suicide Prevention Education:  Education Completed; Mertha Finders [phone] has been  identified as the person(s) who will aid the patient in the event of a mental health crisis (suicidal ideations/suicide attempt). Ms Katrinka Blazing keeps patient's medications in her home and often provides patient with meals.  With written consent from the patient, the family member/significant other has been provided the following suicide prevention education, prior to the and/or following the discharge of the patient.  The suicide prevention education provided includes the following:  Suicide risk factors  Suicide prevention and interventions  National Suicide Hotline telephone number  [facility] assessment telephone number  [facility] Psychiatric Emergency Assistance 911  [facility] and/or Residential Mobile Crisis Unit telephone number  Request made of family/significant other to:  Remove weapons (e.g., guns, rifles, knives), all items previously/currently identified as safety concern.    Remove drugs/medications (over-the-counter, prescriptions, illicit drugs), all items previously/currently identified as a safety concern.  Ms Katrinka Blazing states patient has no access to firearms to the best of her knowledge, nor narcotics  The family member/significant other verbalizes understanding of the suicide prevention education information provided.  The family member/significant other agrees to remove the items of safety concern listed above.  Clide Dales 03/25/2012, 6:24 PM

## 2012-03-25 NOTE — Progress Notes (Signed)
[facility] Group Notes:  (Counselor/Nursing/MHT/Case Management/Adjunct)  03/25/2012 6:32 PM  Type of Therapy:  Group Therapy  Participation Level:  Active  Participation Quality:  Attentive and Sharing  Affect:  Appropriate  Cognitive:  Oriented  Insight:  Limited  Engagement in Group:  Good  Engagement in Therapy:  Limited  Modes of Intervention:  Clarification, Education, Socialization and Support   Summary of Progress/Problems: Group session included an educational portion on Post Acute Withdrawal Syndrome (PAWS) and a processing portion on feelings about relapse and what, if anything, is the motive for recovery.  Wasif shared that "nothing good comes of my drinking other than I get to forget about my problems for a little while, but they seem to get worse."  He also shared about "inability to put it down once I cage a taste for it."   Clide Dales 03/25/2012, 6:32 PM

## 2012-03-25 NOTE — Progress Notes (Signed)
D-Patient is out on unit and appropriate. A- C/O cravings r/t etoh withdrawal. Compliant with scheduled detox protocol and no prn"s requested. Rates depression at 7 and hopelessness at 1.  Moderate tremors and mild H/A. R- Encouraged fluids. Verbalized goal is "to stay good and clean".  Support and positive reenforcement offered. Continue current POC and evaluation of goals. 15' checks continued for safety.

## 2012-03-25 NOTE — H&P (Signed)
Psychiatric Admission Assessment Adult  Patient Identification:  Jerry Small Date of Evaluation:  03/25/2012 Chief Complaint:  Alcohol Dependence History of Present Illness: The patient is a 9 Single AA male who presented to [facility] yesterday requesting help with detox. He states he has been trying to detox himself for the last 2 weeks. He was recently detoxed at [facility] but relapsed shortly after discharge.  He reports drinking 1 6pack a day.   He has a history of alcohol withdrawal seizures, but his last seizure being 3 weeks ago. He denies any substance abuse. Prior to admission he notes decreased sleep reporting about 4 hours a night, his appetite is good, his depression is moderate he rates it as a 7/10, he denies suicidal ideation or homicidal ideation. He reports no prior suicide attempts. He states he does have occasional spots, and visual hallucinations on withdrawal from alcohol. When he notes no auditory or visual hallucinations when not drinking. He rates his anxiety level as fairly moderate at a 7/10, and his stressors include being homeless and having multiple medical problems unemployed and no source of income. He has moderate feelings of hopelessness.   Past Psychiatric History: Diagnosis: Depression versus substance induced mood disorder   Hospitalizations: [facility] for 6 months in 2012   Outpatient Care: Mental health in [facility]   Substance Abuse Care: None   Self-Mutilation: None   Suicidal Attempts: None   Violent Behaviors: None    Past Medical History:   Past Medical History  Diagnosis Date  . Alcohol abuse   . Seizure   . Hypertension   . High cholesterol     Type 2 diabetes reported by the patient  Allergies:  No Known Allergies PTA Medications: Prescriptions prior to admission  Medication Sig Dispense Refill  . carvedilol (COREG) 3.125 MG tablet Take 3.125 mg by mouth 2 (two) times daily with a meal.      . FLUoxetine (PROZAC) 20 MG capsule Take 20  mg by mouth daily.      Marland Kitchen glipiZIDE (GLUCOTROL XL) 5 MG 24 hr tablet Take 5 mg by mouth daily.      . hydrOXYzine (ATARAX/VISTARIL) 25 MG tablet Take 25 mg by mouth at bedtime as needed. For sleep.      . metFORMIN (GLUCOPHAGE) 500 MG tablet Take 500-1,000 mg by mouth 2 (two) times daily with a meal. 1 tab in am, 2 in pm      . phenytoin (DILANTIN) 100 MG ER capsule Take 200 mg by mouth 2 (two) times daily.      . quinapril (ACCUPRIL) 5 MG tablet Take 5 mg by mouth daily.        Previous Psychotropic Medications:  See above    Substance Abuse History in the last 12 months: 6 pack a day. Consequences of Substance Abuse: Medical Consequences:  elevated blood sugars, elevated blood pressure  Social History: Current Place of Residence:   Place of Birth:   Family Members: Marital Status:  Single Children:  Sons:  Daughters: Relationships: Education:  [employer] Problems/Performance: Religious Beliefs/Practices: History of Abuse (Emotional/Phsycial/Sexual) Occupational Experiences; Military History:  None. Legal History: Hobbies/Interests:  Family History:   Family History  Problem Relation Age of Onset  . Hypertension Father   . Seizures Father    ROS: Rash to thighs, runny nose, watery eyes, nausea, vomiting, diarrhea, urinary frequency, polydipsia, urinary frequency, fatigue. PE:  Completed by MD in the ED. Results are reviewed. Mental Status Examination/Evaluation: Objective:  Appearance: fairly groomed  Patent attorney::  Good  Speech:  Clear and Coherent  Volume:  Normal  Mood:  Depressed  Affect:  Congruent  Thought Process:  Coherent  Orientation:  Full  Thought Content:  WDL  Suicidal Thoughts:  No  Homicidal Thoughts:  No  Memory:  Immediate;   Fair  Judgement:  Fair  Insight:  Present  Psychomotor Activity:  Normal  Concentration:  Fair  Recall:  Fair  Akathisia:  No  Handed:  Right  AIMS (if indicated):     Assets:  Communication Skills    Sleep:  Number of Hours: 6     Laboratory/X-Ray Psychological Evaluation(s)      Assessment:    AXIS I:  Alcohol dependence early withdrawal AXIS II:  Deferred AXIS III:   Past Medical History  Diagnosis Date  . Alcohol abuse   . Seizure   . Hypertension   . High cholesterol         Diabetes per patient report AXIS IV:  Homeless, no income, unemployed, minimal support, problems with access to health care and medications. AXIS V:  41-50 serious symptoms  Treatment Plan/Recommendations: 1. Librium detox protocol 2. Restart dilantin as patient is at sub therapeutic level. 3. Restart home medications as indicated. 4. Monitor for worsening symptoms 5. Treat health prob  Treatment Plan Summary: 1. Daily contact with patient to assess and evaluate symptoms and progress in    treatment.  2. Medication management  3. The patient will deny suicidal ideations or homicidal ideations for 48 hours prior to discharge and have a depression and anxiety rating of 3 or less. The patient will also deny any auditory or visual hallucinations or delusional thinking.  4. The patient will deny any symptoms of substance withdrawal at time of discharge.   Current Medications:  Current Facility-Administered Medications  Medication Dose Route Frequency Provider Last Rate Last Dose  . acetaminophen (TYLENOL) tablet 650 mg  650 mg Oral Q6H PRN Mickie D. Adams, PA      . alum & mag hydroxide-simeth (MAALOX/MYLANTA) 200-200-20 MG/5ML suspension 30 mL  30 mL Oral Q4H PRN Mickie D. Adams, PA      . carvedilol (COREG) tablet 3.125 mg  3.125 mg Oral BID WC Mickie D. Adams, PA   3.125 mg at 03/25/12 0801  . chlordiazePOXIDE (LIBRIUM) capsule 25 mg  25 mg Oral Q6H PRN Mickie D. Adams, PA      . chlordiazePOXIDE (LIBRIUM) capsule 25 mg  25 mg Oral QID Mickie D. Adams, PA   25 mg at 03/25/12 0801   Followed by  . chlordiazePOXIDE (LIBRIUM) capsule 25 mg  25 mg Oral TID Mickie D. Adams, PA       Followed by  .  chlordiazePOXIDE (LIBRIUM) capsule 25 mg  25 mg Oral BH-qamhs Mickie D. Adams, PA       Followed by  . chlordiazePOXIDE (LIBRIUM) capsule 25 mg  25 mg Oral Daily Mickie D. Adams, PA      . chlordiazePOXIDE (LIBRIUM) capsule 50 mg  50 mg Oral Once Mickie D. Adams, PA   50 mg at 03/25/12 0238  . FLUoxetine (PROZAC) capsule 20 mg  20 mg Oral Daily Mickie D. Adams, PA   20 mg at 03/25/12 0801  . glipiZIDE (GLUCOTROL XL) 24 hr tablet 5 mg  5 mg Oral Daily Mickie D. Adams, PA   5 mg at 03/25/12 0801  . hydrOXYzine (ATARAX/VISTARIL) tablet 25 mg  25 mg Oral Q6H PRN  Mickie D. Adams, PA      . hydrOXYzine (ATARAX/VISTARIL) tablet 50 mg  50 mg Oral QHS PRN Mickie D. Adams, PA      . lisinopril (PRINIVIL,ZESTRIL) tablet 5 mg  5 mg Oral Daily Mickie D. Adams, PA   5 mg at 03/25/12 0801  . loperamide (IMODIUM) capsule 2-4 mg  2-4 mg Oral PRN Mickie D. Adams, PA      . magnesium hydroxide (MILK OF MAGNESIA) suspension 30 mL  30 mL Oral Daily PRN Mickie D. Adams, PA      . metFORMIN (GLUCOPHAGE) tablet 1,000 mg  1,000 mg Oral Q supper Mickie D. Adams, PA      . metFORMIN (GLUCOPHAGE) tablet 500 mg  500 mg Oral Q breakfast Mickie D. Adams, PA   500 mg at 03/25/12 0801  . multivitamin with minerals tablet 1 tablet  1 tablet Oral Daily Mickie D. Adams, PA   1 tablet at 03/25/12 0801  . ondansetron (ZOFRAN-ODT) disintegrating tablet 4 mg  4 mg Oral Q6H PRN Mickie D. Adams, PA      . phenytoin (DILANTIN) ER capsule 200 mg  200 mg Oral BID Mickie D. Adams, PA   200 mg at 03/25/12 0801  . thiamine (B-1) injection 100 mg  100 mg Intramuscular Once Mickie D. Adams, PA   100 mg at 03/25/12 0238  . thiamine (VITAMIN B-1) tablet 100 mg  100 mg Oral Daily Mickie D. Adams, PA      . DISCONTD: metFORMIN (GLUCOPHAGE) tablet 500-1,000 mg  500-1,000 mg Oral BID WC Mickie D. Pernell Dupre, PA       Facility-Administered Medications Ordered in Other Encounters  Medication Dose Route Frequency Provider Last Rate Last Dose  . DISCONTD:  alum & mag hydroxide-simeth (MAALOX/MYLANTA) 200-200-20 MG/5ML suspension 30 mL  30 mL Oral PRN Shaaron Adler, PA-C      . DISCONTD: carvedilol (COREG) tablet 3.125 mg  3.125 mg Oral BID WC Shaaron Adler, PA-C   3.125 mg at 03/24/12 1640  . DISCONTD: FLUoxetine (PROZAC) capsule 20 mg  20 mg Oral Daily Shaaron Adler, PA-C   20 mg at 03/24/12 0931  . DISCONTD: folic acid (FOLVITE) tablet 1 mg  1 mg Oral Daily Shaaron Adler, PA-C   1 mg at 03/24/12 0931  . DISCONTD: glipiZIDE (GLUCOTROL XL) 24 hr tablet 5 mg  5 mg Oral Daily Shaaron Adler, PA-C   5 mg at 03/24/12 0932  . DISCONTD: hydrOXYzine (ATARAX/VISTARIL) tablet 25 mg  25 mg Oral QHS PRN Shaaron Adler, PA-C      . DISCONTD: ibuprofen (ADVIL,MOTRIN) tablet 600 mg  600 mg Oral Q8H PRN Shaaron Adler, PA-C   600 mg at 03/24/12 1717  . DISCONTD: lisinopril (PRINIVIL,ZESTRIL) tablet 5 mg  5 mg Oral Daily Shaaron Adler, PA-C   5 mg at 03/24/12 0932  . DISCONTD: LORazepam (ATIVAN) injection 1 mg  1 mg Intravenous Q6H PRN Shaaron Adler, PA-C      . DISCONTD: LORazepam (ATIVAN) tablet 0-4 mg  0-4 mg Oral Q6H Shaaron Adler, PA-C   1 mg at 03/24/12 1719  . DISCONTD: LORazepam (ATIVAN) tablet 0-4 mg  0-4 mg Oral Q12H Shaaron Adler, PA-C   1 mg at 03/24/12 1716  . DISCONTD: LORazepam (ATIVAN) tablet 1 mg  1 mg Oral Q6H PRN Shaaron Adler, PA-C      . DISCONTD: metFORMIN (GLUCOPHAGE) tablet 1,000 mg  1,000 mg Oral Q supper  Celene Kras, MD   1,000 mg at 03/24/12 1640  . DISCONTD: metFORMIN (GLUCOPHAGE) tablet 500 mg  500 mg Oral Q breakfast Celene Kras, MD   500 mg at 03/24/12 0749  . DISCONTD: multivitamin with minerals tablet 1 tablet  1 tablet Oral Daily Shaaron Adler, PA-C   1 tablet at 03/24/12 0932  . DISCONTD: ondansetron (ZOFRAN) tablet 4 mg  4 mg Oral Q8H PRN Shaaron Adler, PA-C      . DISCONTD: phenytoin (DILANTIN) ER  capsule 200 mg  200 mg Oral BID Shaaron Adler, PA-C   200 mg at 03/24/12 1610  . DISCONTD: thiamine (B-1) injection 100 mg  100 mg Intravenous Daily Shaaron Adler, [place]      . DISCONTD: thiamine (VITAMIN B-1) tablet 100 mg  100 mg Oral Daily Shaaron Adler, PA-C   100 mg at 03/24/12 9604    Observation Level/Precautions:  Detox  Laboratory:  HgbA1c  Psychotherapy:    Medications:    Routine PRN Medications:  Yes  Consultations:    Discharge Concerns:    Other:     Lloyd Huger T. Elizabethann Lackey PAC  8/2/201310:16 AM

## 2012-03-25 NOTE — Tx Team (Signed)
Interdisciplinary Treatment Plan Update (Adult)  Date:  03/25/2012 Time Reviewed:  10:44 AM  Progress in Treatment: Attending groups: Yes Participating in groups:  Yes Taking medication as prescribed: Yes Tolerating medication:  Yes Family/Significant other contact made:  Counselor attempting to contact supports Patient understands diagnosis:  Yes Discussing patient identified problems/goals with staff:  Yes Medical problems stabilized or resolved:  Yes Denies suicidal/homicidal ideation: Yes Issues/concerns per patient self-inventory:  None identified Other: N/A  New problem(s) identified: None Identified  Reason for Continuation of Hospitalization: Medication stabilization Withdrawal symptoms  Interventions implemented related to continuation of hospitalization: mood stabilization, medication monitoring and adjustment, group therapy and psycho education, safety checks q 15 mins  Additional comments: N/A  Estimated length of stay: 3-5 days  Discharge Plan: Return to [facility]  New goal(s): N/A  Review of initial/current patient goals per problem list:    1.  Goal(s): Address substance use  Met:  No  Target date: by discharge  As evidenced by: completing detox protocol and referral to [facility]  2.  Goal (s): Reduce depressive symptoms  Met:  No  Target date: by discharge  As evidenced by: Reducing depression from a 10 to a 3 as reported by pt.     Attendees: Patient:  Jerry Small 03/25/2012 10:45 AM   Family:     Physician:  Lupe Carney, DO 03/25/2012 10:45 AM   Nursing:    Case Manager:  Barrie Folk, RN 03/25/2012 10:45 AM   Counselor:  Ronda Fairly, LCSWA 03/25/2012 10:45 AM   Other: Helane Rima RN 03/25/2012 10:45 AM    Other:     Other:     Other:      Scribe for Treatment Team:   Barrie Folk RN MS EDS 03/25/2012 10:44 AM

## 2012-03-25 NOTE — Progress Notes (Signed)
[facility] Group Notes:  (Counselor/Nursing/MHT/Case Management/Adjunct)  03/25/2012 2:58 PM  Type of Therapy:  Psychoeducational Skills  Participation Level:  Did Not Attend   Summary of Progress/Problems: Patient did not attend group.   Ardelle Park O 03/25/2012, 2:58 PM

## 2012-03-25 NOTE — Progress Notes (Signed)
[facility] Group Notes:  (Counselor/Nursing/MHT/Case Management/Adjunct)  03/25/2012 2:54 PM  Type of Therapy:  Psychoeducational Skills  Participation Level:  Active  Participation Quality:  Appropriate  Affect:  Appropriate  Cognitive:  Appropriate  Insight:  Good  Engagement in Group:  Good  Engagement in Therapy:  Good  Modes of Intervention:  Education  Summary of Progress/Problems:Staff informed the patients of today's theme of "Relapse Prevention". Patient stated that he was an alcoholic and this is the reason why he's at [facility]. Patient is optimistic about his recovery process and shared that he will attend [facility] once discharged. Patient stated that he needs to change his lifestyle And his environment in order to be successful. Patient also stressed that he needs to find a job so that he can have some stability.    Ardelle Park O 03/25/2012, 2:54 PM

## 2012-03-25 NOTE — H&P (Signed)
Pt seen and evaluated upon admission.  See full H&P/PA.  Completed Admission Suicide Risk Assessment.  See orders.  Pt agreeable with plan.  Discussed with team.   

## 2012-03-25 NOTE — Progress Notes (Addendum)
D: Pt denies SI/HI/AVH. Pt rates his hopelessness as 7. He rates depression and anxiety both as 5. He rates his agitation as 3. Pt states that his energy level is low. Pt states that he did not sleep well last night. Pt does have noticeable tremors. Pt attended night group. A: Support and encouragement. Continue Q 15 min checks for safety. R: Pt receptive. Pt remains safe on the unit.

## 2012-03-25 NOTE — Progress Notes (Signed)
Psychoeducational Group Note  Date:  03/25/2012 Time:  2000  Group Topic/Focus:  AA group  Participation Level:  Active  Participation Quality:  Appropriate  Affect:  Appropriate  Cognitive:  Alert  Insight:  Good  Engagement in Group:  Good  Additional Comments:  Pt attended and participated in group this evening.  Kaleen Odea R 03/25/2012, 10:37 PM

## 2012-03-26 LAB — HEMOGLOBIN A1C
Hgb A1c MFr Bld: 6.7 % — ABNORMAL HIGH (ref <5.7)
Mean Plasma Glucose: 146 mg/dL — ABNORMAL HIGH (ref <117)

## 2012-03-26 NOTE — Progress Notes (Signed)
[facility] Group Notes:  (Counselor/Nursing/MHT/Case Management/Adjunct)  03/26/2012 1:15 PM  Type of Therapy:  Group Therapy, Dance/Movement Therapy   Participation Level:  Did Not Attend   Jerry Small 03/26/2012. 2:07 PM  

## 2012-03-26 NOTE — Progress Notes (Signed)
Psychoeducational Group Note  Date:  03/26/2012 Time:  1015  Group Topic/Focus:  Making Healthy Choices:   The focus of this group is to help patients identify negative/unhealthy choices they were using prior to admission and identify positive/healthier coping strategies to replace them upon discharge.  Participation Level:  None  Participation Quality:  Drowsy  Affect:  Flat  Cognitive:  Appropriate  Insight:  None  Engagement in Group:  None  Additional Comments: Slept through group.  Cresenciano Lick 03/26/2012, 1:16 PM

## 2012-03-26 NOTE — Progress Notes (Signed)
D- Patient is out in milieu interacting with peers and attending groups. Anergic earlier in shift. A- Rates depression a t2 and hopelessness at 4.  Denies SI. Gaining insight into concept of long term recovery.  R- Positive support and encouragement offered. Continue current POC and evaluation of treatment goals.  Continue 15' checks for safety.

## 2012-03-26 NOTE — Progress Notes (Signed)
Psychoeducational Group Note  Date:  03/26/2012 Time:  1515  Group Topic/Focus:  Self Care:   The focus of this group is to help patients understand the importance of self-care in order to improve or restore emotional, physical, spiritual, interpersonal, and financial health.  Participation Level:  Did Not Attend  Participation Quality:  Did not attend  Affect:  Appropriate  Cognitive:  Appropriate  Insight:  None  Engagement in Group:  None  Additional Comments:  Patient remained in bed  Aaidyn San R 03/26/2012, 4:56 PM

## 2012-03-26 NOTE — Progress Notes (Signed)
  Kenderick Kobler is a 51 y.o. male [id number] [birth year]  03/24/2012 Principal Problem:  *Alcohol dependence   Mental Status: Mood is allright denies SI/HI/AVH   Subjective/Objective:  Has a fine hand tremor otherwise no S&S of detox. Asks about some "bumps" he noticed on the inside of his thighs-may be from detergent.  Filed Vitals:   03/26/12 0701  BP: 136/86  Pulse: 75  Temp:   Resp:     Lab Results:   BMET    Component Value Date/Time   NA 135 03/23/2012 1035   K 3.7 03/23/2012 1035   CL 100 03/23/2012 1035   CO2 26 03/23/2012 1035   GLUCOSE 177* 03/23/2012 1035   BUN 13 03/23/2012 1035   CREATININE 0.85 03/23/2012 1035   CALCIUM 9.0 03/23/2012 1035   GFRNONAA >90 03/23/2012 1035   GFRAA >90 03/23/2012 1035    Medications:  Scheduled:     . carvedilol  3.125 mg Oral BID WC  . chlordiazePOXIDE  25 mg Oral QID   Followed by  . chlordiazePOXIDE  25 mg Oral TID   Followed by  . chlordiazePOXIDE  25 mg Oral BH-qamhs   Followed by  . chlordiazePOXIDE  25 mg Oral Daily  . FLUoxetine  20 mg Oral Daily  . glipiZIDE  5 mg Oral Daily  . lisinopril  5 mg Oral Daily  . metFORMIN  1,000 mg Oral Q supper  . metFORMIN  500 mg Oral Q breakfast  . multivitamin with minerals  1 tablet Oral Daily  . phenytoin  200 mg Oral BID  . thiamine  100 mg Oral Daily     PRN Meds acetaminophen, alum & mag hydroxide-simeth, chlordiazePOXIDE, hydrOXYzine, hydrOXYzine, loperamide, magnesium hydroxide, ondansetron  Plan: continue current plan of care.   Hale Chalfin,MICKIE D. 03/26/2012

## 2012-03-26 NOTE — Progress Notes (Signed)
Pt. attended and participated in aftercare planning group. Pt. accepted information on suicide prevention, warning signs to look for with suicide and crisis line numbers to use. The pt. agreed to call crisis line numbers if having warning signs or having thoughts of suicide. Pt. listed their current anxiety level as "middle". Pt stated he has no current concerns about after care.

## 2012-03-27 LAB — PHENYTOIN LEVEL, TOTAL: Phenytoin Lvl: 9 ug/mL — ABNORMAL LOW (ref 10.0–20.0)

## 2012-03-27 MED ORDER — HYDROCERIN EX CREA
TOPICAL_CREAM | Freq: Two times a day (BID) | CUTANEOUS | Status: DC | PRN
  Filled 2012-03-27: qty 113

## 2012-03-27 MED ORDER — COLLOIDAL OATMEAL BATH EX PACK
PACK | Freq: Every day | CUTANEOUS | Status: DC
  Filled 2012-03-27 (×9): qty 1

## 2012-03-27 MED ORDER — TRIAMCINOLONE 0.1 % CREAM:EUCERIN CREAM 1:1: TOPICAL_CREAM | Freq: Two times a day (BID) | CUTANEOUS | Status: DC | PRN

## 2012-03-27 MED ORDER — AVEENO MOISTURIZING EX BAR: CHEWABLE_BAR | Freq: Every day | CUTANEOUS | Status: DC

## 2012-03-27 MED ORDER — AVEENO MOISTURIZING EX BAR
CHEWABLE_BAR | Freq: Every day | CUTANEOUS | Status: DC
  Administered 2012-03-27: 13:00:00 via TOPICAL
  Administered 2012-03-28: 1 via TOPICAL
  Filled 2012-03-27: qty 1

## 2012-03-27 MED ORDER — TRIAMCINOLONE ACETONIDE 0.1 % EX CREA
TOPICAL_CREAM | Freq: Two times a day (BID) | CUTANEOUS | Status: DC | PRN
  Administered 2012-03-27: 17:00:00 via TOPICAL
  Filled 2012-03-27: qty 15

## 2012-03-27 NOTE — Progress Notes (Signed)
D: Pt denies SI/HI/AVH. Pt rates his depression as 5, hopelessness as 3, and anxiety as 5. Pt states "I am broke, I have no job, and I am homeless. Pt did not attend evening group. He slept throughout the evening. Pt affect is bright and appropriate. Pt cooperative. A: Support and encouragement offered to pt. Q 15 min checks continued for safety. R: Pt receptive. Pt remains safe on the unit.

## 2012-03-27 NOTE — Progress Notes (Signed)
  Jerald Hennington is a 51 y.o. male [id number] October 28, 1960  03/24/2012 Principal Problem:  *Alcohol dependence   Mental Status: Mood is better denies SI/HI/AVH.   Subjective/Objective: Surprised at how much he has slept.Detoxing without incident. Will order Aveeno bath for him and Eucerin cream.   Filed Vitals:   03/27/12 0701  BP: 134/88  Pulse: 80  Temp:   Resp:     Lab Results:   BMET    Component Value Date/Time   NA 135 03/23/2012 1035   K 3.7 03/23/2012 1035   CL 100 03/23/2012 1035   CO2 26 03/23/2012 1035   GLUCOSE 177* 03/23/2012 1035   BUN 13 03/23/2012 1035   CREATININE 0.85 03/23/2012 1035   CALCIUM 9.0 03/23/2012 1035   GFRNONAA >90 03/23/2012 1035   GFRAA >90 03/23/2012 1035    Medications:  Scheduled:     . carvedilol  3.125 mg Oral BID WC  . chlordiazePOXIDE  25 mg Oral QID   Followed by  . chlordiazePOXIDE  25 mg Oral TID   Followed by  . chlordiazePOXIDE  25 mg Oral BH-qamhs   Followed by  . chlordiazePOXIDE  25 mg Oral Daily  . FLUoxetine  20 mg Oral Daily  . glipiZIDE  5 mg Oral Daily  . lisinopril  5 mg Oral Daily  . metFORMIN  1,000 mg Oral Q supper  . metFORMIN  500 mg Oral Q breakfast  . multivitamin with minerals  1 tablet Oral Daily  . phenytoin  200 mg Oral BID  . thiamine  100 mg Oral Daily     PRN Meds acetaminophen, alum & mag hydroxide-simeth, chlordiazePOXIDE, hydrOXYzine, hydrOXYzine, loperamide, magnesium hydroxide, ondansetron  Plan: Continue current  plan of care wants to go to [facility]          Will order Aveeno bath and Eucerin cream  Latrisa Hellums,MICKIE D. 03/27/2012

## 2012-03-27 NOTE — Progress Notes (Signed)
D- Patient out in milieu interacting with peers and attending groups. A- Affect. Mood and behavior are bright.  Rates depression at 4 and hopelessness at 3.  Denies SI. Compliant with scheduled medication protocol and no prn's requested. R- AA concepts reenforced. Continue with current POC and evaluation of treatment goals.  Continue 15' checks for safety.

## 2012-03-27 NOTE — Progress Notes (Signed)
Psychoeducational Group Note  Date:  03/27/2012 Time:  1515  Group Topic/Focus:  Conflict Resolution:   The focus of this group is to discuss the conflict resolution process and how it may be used upon discharge.  Participation Level:  Active  Participation Quality:  Appropriate and Attentive  Affect:  Appropriate  Cognitive:  Alert and Appropriate  Insight:  Good  Engagement in Group:  Good  Additional Comments:  Pt. Participated in group by listening and filling out worksheets.   Ruta Hinds [facility] 03/27/2012, 6:29 PM

## 2012-03-27 NOTE — Progress Notes (Signed)
[facility] Group Notes:  (Counselor/Nursing/MHT/Case Management/Adjunct)  03/27/2012 1:15 PM  Type of Therapy:  Group Therapy, Dance/Movement Therapy   Participation Level:  Minimal  Participation Quality:  Attentive  Affect:  Appropriate  Cognitive:  Appropriate  Insight:  Limited  Engagement in Group:  Limited  Engagement in Therapy:  Limited  Modes of Intervention:  Clarification, Problem-solving, Role-play, Socialization and Support  Summary of Progress/Problems: group focused on how to positively use support systems and how to find ways to offer inner/personal support in recovery. Pt spoke about stopping the cycle of use, increasing self confidence and using support systems. Pt read the poem "I am your recovery" and spoke about how this can be used in a daily recovery plan to offer hope and inspiration.

## 2012-03-27 NOTE — Progress Notes (Signed)
Pt did not attend evening wrap up group.  Pt remained in bed, asleep.   

## 2012-03-27 NOTE — Progress Notes (Signed)
Pt did not attend aftercare planning group but did accept the daily workbook on support systems. 

## 2012-03-27 NOTE — Progress Notes (Signed)
Psychoeducational Group Note  Date:  03/27/2012 Time:  1015  Group Topic/Focus:  Crisis Planning:   The purpose of this group is to help patients create a crisis plan for use upon discharge or in the future, as needed.  Participation Level:  Minimal  Participation Quality:  Appropriate and Attentive  Affect:  Appropriate  Cognitive:  Alert and Appropriate  Insight:  Good  Engagement in Group:  Limited  Additional Comments:    Cresenciano Lick 03/27/2012, 11:19 AM

## 2012-03-27 NOTE — Progress Notes (Signed)
Patient ID: Jerry Small, male   DOB: Jan 17, 1961, 51 y.o.   MRN: [id number]  Problem: Alcohol Dependence  D: Pt isolative to his room, with no peer interaction. Pt endorses depression.  A: Monitor patient Q 15 minutes for safety, encourage staff/peer interaction and group participation. Administer medications as ordered by MD.  R: Pt compliant with medications, but did not attend group session, remaining in his room. Pt denies SI or plans to harm himself.

## 2012-03-28 LAB — GLUCOSE, CAPILLARY: Glucose-Capillary: 123 mg/dL — ABNORMAL HIGH (ref 70–99)

## 2012-03-28 MED ORDER — PHENYTOIN SODIUM EXTENDED 100 MG PO CAPS: 200.0000 mg | ORAL_CAPSULE | Freq: Two times a day (BID) | ORAL | Status: DC

## 2012-03-28 MED ORDER — LISINOPRIL 10 MG PO TABS
5.0000 mg | ORAL_TABLET | Freq: Every day | ORAL | Status: DC
  Filled 2012-03-28 (×2): qty 7

## 2012-03-28 MED ORDER — QUINAPRIL HCL 5 MG PO TABS: 5.0000 mg | ORAL_TABLET | Freq: Every day | ORAL | Status: DC

## 2012-03-28 MED ORDER — CARVEDILOL 3.125 MG PO TABS: 3.1250 mg | ORAL_TABLET | Freq: Two times a day (BID) | ORAL | Status: DC

## 2012-03-28 MED ORDER — FLUOXETINE HCL 20 MG PO CAPS: 20.0000 mg | ORAL_CAPSULE | Freq: Every day | ORAL | Status: DC

## 2012-03-28 MED ORDER — GLIPIZIDE ER 5 MG PO TB24: 5.0000 mg | ORAL_TABLET | Freq: Every day | ORAL | Status: DC

## 2012-03-28 MED ORDER — PHENYTOIN SODIUM EXTENDED 100 MG PO CAPS
200.0000 mg | ORAL_CAPSULE | Freq: Two times a day (BID) | ORAL | Status: DC
  Filled 2012-03-28 (×2): qty 56

## 2012-03-28 MED ORDER — METFORMIN HCL 500 MG PO TABS
500.0000 mg | ORAL_TABLET | Freq: Every day | ORAL | Status: DC
  Filled 2012-03-28: qty 42

## 2012-03-28 MED ORDER — VITAMIN B-1 100 MG PO TABS
100.0000 mg | ORAL_TABLET | Freq: Every day | ORAL | Status: DC
  Filled 2012-03-28 (×2): qty 14

## 2012-03-28 MED ORDER — METFORMIN HCL 500 MG PO TABS: ORAL_TABLET | ORAL | Status: DC

## 2012-03-28 MED ORDER — CARVEDILOL 6.25 MG PO TABS
3.1250 mg | ORAL_TABLET | Freq: Two times a day (BID) | ORAL | Status: DC
  Filled 2012-03-28: qty 14

## 2012-03-28 MED ORDER — GLIPIZIDE ER 5 MG PO TB24
5.0000 mg | ORAL_TABLET | Freq: Every day | ORAL | Status: DC
  Filled 2012-03-28: qty 14

## 2012-03-28 MED ORDER — ADULT MULTIVITAMIN W/MINERALS CH
1.0000 | ORAL_TABLET | Freq: Every day | ORAL | Status: DC
Start: 2012-03-28 — End: 2012-03-28
  Filled 2012-03-28 (×2): qty 14

## 2012-03-28 MED ORDER — FLUOXETINE HCL 20 MG PO CAPS
20.0000 mg | ORAL_CAPSULE | Freq: Every day | ORAL | Status: DC
  Filled 2012-03-28: qty 1

## 2012-03-28 NOTE — Progress Notes (Signed)
Patient did not attend the evening speaker AA meeting.  Pt was notified of meeting but remained in bed and slept.

## 2012-03-28 NOTE — Progress Notes (Signed)
Psychoeducational Group Note  Date:  03/28/2012 Time:  1000  Group Topic/Focus:  Self Care:   The focus of this group is to help patients understand the importance of self-care in order to improve or restore emotional, physical, spiritual, interpersonal, and financial health.  Participation Level:  Active  Participation Quality:  Appropriate  Affect:  Appropriate  Cognitive:  Appropriate  Insight:  Good  Engagement in Group:  Good  Additional Comments:  Pt was able to attend group this morning and participated well.  Rhoderick Farrel E 03/28/2012, 5:10 PM

## 2012-03-28 NOTE — [facility] Suicide Risk Assessment (Signed)
Suicide Risk Assessment  Discharge Assessment     Demographic factors:  Male;Unemployed    Current Mental Status Per Nursing Assessment::   On Admission:    At Discharge:     Current Mental Status Per Physician: Patient denies suicidal or homicidal ideation, hallucinations, illusions, or delusions. Patient engages with good eye contact, is able to focus adequately in a one to one setting, and has clear goal directed thoughts. Patient speaks with a natural conversational volume, rate, and tone. Anxiety was reported at 3 on a scale of 1 the least and 10 the most. Depression was reported at 1 on the same scale. Patient is oriented times 4, recent and remote memory intact. Judgement: limited by his addicitons Insight: fair  Historical Factors: Family history of mental illness or substance abuse  Continued Clinical Symptoms:  Alcohol/Substance Abuse/Dependencies  Discharge Diagnoses:   AXIS I:  Alcohol Abuse AXIS II:  Deferred AXIS III:   Past Medical History  Diagnosis Date  . Alcohol abuse   . Seizure   . Hypertension   . High cholesterol    AXIS IV:  other psychosocial or environmental problems AXIS V:  61-70 mild symptoms  Cognitive Features That Contribute To Risk:  Thought constriction (tunnel vision)    Suicide Risk:  Minimal: No identifiable suicidal ideation.  Patients presenting with no risk factors but with morbid ruminations; may be classified as minimal risk based on the severity of the depressive symptoms  Labs: Results for orders placed during the hospital encounter of 03/24/12 (from the past 72 hour(s))  PHENYTOIN LEVEL, TOTAL     Status: Abnormal   Collection Time   03/27/12  6:49 AM      Component Value Range Comment   Phenytoin Lvl 9.0 (*) 10.0 - 20.0 ug/mL   GLUCOSE, CAPILLARY     Status: Abnormal   Collection Time   03/28/12  5:15 PM      Component Value Range Comment   Glucose-Capillary 123 (*) 70 - 99 mg/dL     RISK REDUCTION FACTORS: What  pt has learned from hospital stay is how to keep their cool when things get going and that they have got to stay clean  Risk of self harm is elevated by their addictions, but they realize that they have themselves to live for.  Risk of harm to others is minimal in that he has not been involved in fights or had any legal charges filed on him.  Pt seen in treatment team where they divulged the above information. The treatment team concluded that they was ready for discharge and had met their goals for an inpatient setting.   PLAN: Discharge home Continue Medication List  As of 03/28/2012  8:48 PM   STOP taking these medications         hydrOXYzine 25 MG tablet         TAKE these medications      Indication    carvedilol 3.125 MG tablet   Commonly known as: COREG   Take 1 tablet (3.125 mg total) by mouth 2 (two) times daily with a meal. For blood pressure       FLUoxetine 20 MG capsule   Commonly known as: PROZAC   Take 1 capsule (20 mg total) by mouth daily. For depression       glipiZIDE 5 MG 24 hr tablet   Commonly known as: GLUCOTROL XL   Take 1 tablet (5 mg total) by mouth daily. For sugar  Indication: Type 2 Diabetes      metFORMIN 500 MG tablet   Commonly known as: GLUCOPHAGE   Take 1 tablet at breakfast and 2 tablets at supper for sugar    Indication: Type 2 Diabetes      phenytoin 100 MG ER capsule   Commonly known as: DILANTIN   Take 2 capsules (200 mg total) by mouth 2 (two) times daily. To prevent seizures    Indication: Seizure      quinapril 5 MG tablet   Commonly known as: ACCUPRIL   Take 1 tablet (5 mg total) by mouth daily. For blood pressure    Indication: High Blood Pressure           Follow-up recommendations:  Activities: Resume typical activities Diet: Resume typical diet Other: Follow up with outpatient provider and report any side effects to out patient prescriber.   Kameren Pargas 03/28/2012, 8:48 PM

## 2012-03-28 NOTE — Progress Notes (Signed)
Pt resting in room.  Encouraged pt to participate in group.

## 2012-03-28 NOTE — Progress Notes (Signed)
[facility] Group Notes:  (Counselor/Nursing/MHT/Case Management/Adjunct)  03/28/2012 3:27 PM   Type of Therapy:  Group Therapy from 1:15 to 2:30 PM  Participation Level:  Did Not Attend   Jerry Small  03/28/2012 3:27 PM

## 2012-03-28 NOTE — Progress Notes (Signed)
Per pt's self inventory pt slept well, appetite is good, energy level is normal, ability to pay attention is good, depression is rated at a 3 and hopelessness at a 4, pt is experiencing cravings and denies anxiety denies SI, c/o headaches, dizziness, and blurred vision, pain is at 1 7 out of 10, his goal after discharge is to stay clean, his plan is to go back to [facility] after discharge

## 2012-03-28 NOTE — Progress Notes (Signed)
Pt attended discharge planning group and actively participated in group.  SW provided pt with today's workbook.  Pt presents with calm mood and affect.  Pt denies having depression, anxiety and SI.  Pt states that he came here after trying to go to [facility] and they sent him here for detox.  Pt states that he plans to go back to [facility] after detox.  SW contacted [facility] and pt is scheduled to go to [facility] tomorrow morning at 8:00 am.  Pt states that he has his own transportation there.  No further needs voiced by pt at this time.  Safety planning and suicide prevention discussed.  Pt participated in discussion and acknowledged an understanding of the information provided.       Srijan Givan Horton, LCSWA 03/28/2012  1:00 PM

## 2012-03-29 DIAGNOSIS — F1020 Alcohol dependence, uncomplicated: Secondary | ICD-10-CM

## 2012-03-29 NOTE — Progress Notes (Signed)
Patient ID: Jerry Small, male   DOB: 24-Jun-1961, 51 y.o.   MRN: [id number]  Pt asleep; no s/s of distress noted at this time.

## 2012-03-29 NOTE — Progress Notes (Addendum)
Patient ID: Jerry Small, male   DOB: 01/14/1961, 51 y.o.   MRN: [id number]  Pt discharged with plans to follow up with [facility] this am. Pt with private transport by family. Pt denies SI or plans to harm himself at this time. No s/s of distress noted. Pt verbalizes an understanding of discharge instructions, medication administration and follow up appointments. Pt's belongings returned to him and pt verifies all belongings present at discharge.

## 2012-03-30 NOTE — Progress Notes (Signed)
Patient Discharge Instructions:  After Visit Summary (AVS):   Faxed to:  03/30/2012 Psychiatric Admission Assessment Note:   Faxed to:  03/30/2012 Suicide Risk Assessment - Discharge Assessment:   Faxed to:  03/30/2012 Faxed/Sent to the Next Level Care provider:  03/30/2012  Faxed to [facility] @ [phone]  Wandra Scot, 03/30/2012, 5:34 PM

## 2012-04-05 ENCOUNTER — Encounter (HOSPITAL_BASED_OUTPATIENT_CLINIC_OR_DEPARTMENT_OTHER): Payer: Self-pay

## 2012-04-05 ENCOUNTER — Emergency Department (HOSPITAL_BASED_OUTPATIENT_CLINIC_OR_DEPARTMENT_OTHER)
Admission: EM | Admit: 2012-04-05 | Discharge: 2012-04-05 | Disposition: A | Discharge status: Home / Self Care | Payer: Medicaid Other | Attending: Emergency Medicine | Admitting: Emergency Medicine

## 2012-04-05 DIAGNOSIS — E7800 Pure hypercholesterolemia, unspecified: Secondary | ICD-10-CM | POA: Insufficient documentation

## 2012-04-05 DIAGNOSIS — R21 Rash and other nonspecific skin eruption: Secondary | ICD-10-CM | POA: Insufficient documentation

## 2012-04-05 DIAGNOSIS — I10 Essential (primary) hypertension: Secondary | ICD-10-CM | POA: Insufficient documentation

## 2012-04-05 MED ORDER — PREDNISONE 20 MG PO TABS: 40.0000 mg | ORAL_TABLET | Freq: Every day | ORAL | Status: DC

## 2012-04-05 MED ORDER — PREDNISONE 50 MG PO TABS
60.0000 mg | ORAL_TABLET | Freq: Once | ORAL | Status: AC
  Administered 2012-04-05: 60 mg via ORAL
  Filled 2012-04-05: qty 1

## 2012-04-05 MED ORDER — SULFAMETHOXAZOLE-TMP DS 800-160 MG PO TABS
1.0000 | ORAL_TABLET | Freq: Once | ORAL | Status: AC
  Administered 2012-04-05: 1 via ORAL
  Filled 2012-04-05: qty 1

## 2012-04-05 MED ORDER — SULFAMETHOXAZOLE-TRIMETHOPRIM 800-160 MG PO TABS: 1.0000 | ORAL_TABLET | Freq: Two times a day (BID) | ORAL | Status: AC

## 2012-04-05 NOTE — ED Notes (Signed)
MD at bedside. 

## 2012-04-05 NOTE — ED Provider Notes (Signed)
History     CSN: [id number]  Arrival date & time 04/05/12  0902   First MD Initiated Contact with Patient 04/05/12 [phone]      Chief Complaint  Patient presents with  . Rash  . Urticaria    (Consider location/radiation/quality/duration/timing/severity/associated sxs/prior treatment) HPI 51 year old male comes in with rash on his arms for the last several days. Rash consists of erythematous areas which are very itchy. Of note, he had a rash on his legs 2 weeks ago which was itchy but that has resolved. He denies fever, chills, sweats. He denies any pain anywhere. He has not done any kind of treatment. Nothing makes the rash better and nothing makes it worse. He denies any lesions on the trunk and he denies any new lesions on his legs. Past Medical History  Diagnosis Date  . Alcohol abuse   . Seizure   . Hypertension   . High cholesterol     Past Surgical History  Procedure Date  . Cholecystectomy     Family History  Problem Relation Age of Onset  . Hypertension Father   . Seizures Father     History  Substance Use Topics  . Smoking status: Never Smoker   . Smokeless tobacco: Never Used  . Alcohol Use: Yes     6 pack daily  last drink 1 week ago      Review of Systems  Allergies  Review of patient's allergies indicates no known allergies.  Home Medications   Current Outpatient Rx  Name Route Sig Dispense Refill  . CARVEDILOL 3.125 MG PO TABS Oral Take 1 tablet (3.125 mg total) by mouth 2 (two) times daily with a meal. For blood pressure 60 tablet 0  . FLUOXETINE HCL 20 MG PO CAPS Oral Take 1 capsule (20 mg total) by mouth daily. For depression 30 capsule 0  . GLIPIZIDE ER 5 MG PO TB24 Oral Take 1 tablet (5 mg total) by mouth daily. For sugar 30 tablet 0  . METFORMIN HCL 500 MG PO TABS  Take 1 tablet at breakfast and 2 tablets at supper for sugar 90 tablet 0  . PHENYTOIN SODIUM EXTENDED 100 MG PO CAPS Oral Take 2 capsules (200 mg total) by mouth 2 (two) times  daily. To prevent seizures 120 capsule 0  . PREDNISONE 20 MG PO TABS Oral Take 2 tablets (40 mg total) by mouth daily. 10 tablet 0  . QUINAPRIL HCL 5 MG PO TABS Oral Take 1 tablet (5 mg total) by mouth daily. For blood pressure 30 tablet 0  . SULFAMETHOXAZOLE-TRIMETHOPRIM 800-160 MG PO TABS Oral Take 1 tablet by mouth every 12 (twelve) hours. 10 tablet 0    BP 152/91  Pulse 61  Temp 98.7 F (37.1 C) (Oral)  Resp 20  SpO2 100%  Physical Exam 51year old male, resting comfortably and in no acute distress. Vital signs are significant for mild hypertension with blood pressure 152/91. Oxygen saturation is 100%, which is normal. Head is normocephalic and atraumatic. PERRLA, EOMI. Oropharynx is clear. Neck is nontender and supple without adenopathy or JVD. Back is nontender and there is no CVA tenderness. Lungs are clear without rales, wheezes, or rhonchi. Chest is nontender. Heart has regular rate and rhythm without murmur. Abdomen is soft, flat, nontender without masses or hepatosplenomegaly and peristalsis is normoactive. Extremities have no cyanosis or edema, full range of motion is present. Skin is warm and dry. Flat, hyperpigmented areas are noted on his legs. Each of these is about  4 mm in diameter and this apparently is the prior rash which has been resolving. His arms have erythematous and slightly raised areas which are about 3 cm in diameter. They're firm and in consistency without any fluctuance. Some have small vesicles/pustules at the center. No other rash is seen. Neurologic: Mental status is normal, cranial nerves are intact, there are no motor or sensory deficits.  ED Course  Procedures (including critical care time)  Labs Reviewed - No data to display No results found.   1. Rash       MDM  Resolving leg rash which is of uncertain cause. Arm rash of uncertain cause. It is not classic urticaria, but is not clearly infectious either. Rash may be an early finding of  another illness. He will be treated empirically with a short course of prednisone and with a course of Bactrim. He is advised to return to the emergency department should new symptoms arise or if his rash gets worse.        Dione Booze, MD 04/05/12 [phone]

## 2012-04-05 NOTE — Discharge Summary (Signed)
Physician Discharge Summary Note  Patient:  Jerry Small is an 51 y.o., male MRN:  [id number] DOB:  November 25, 1960 Patient phone:  [phone] (home)  Patient address:   [address] [place] [address]   Date of Admission:  03/24/2012 Date of Discharge: 03/29/2012  Discharge Diagnoses: Principal Problem:  *Alcohol dependence  Axis Diagnosis:  AXIS I: Alcohol Abuse  AXIS II: Deferred  AXIS III:  Past Medical History   Diagnosis  Date   .  Alcohol abuse    .  Seizure    .  Hypertension    .  High cholesterol     AXIS IV: other psychosocial or environmental problems  AXIS V: 61-70 mild symptoms   Level of Care:  OP  Hospital Course:   The patient is a 53 Single AA male who presented to [facility] yesterday requesting help with detox. He states he has been trying to detox himself for the last 2 weeks. He was recently detoxed at [facility] but relapsed shortly after discharge. He reports drinking 1 6pack a day.  He has a history of alcohol withdrawal seizures, but his last seizure being 3 weeks ago. He denies any substance abuse. Prior to admission he notes decreased sleep reporting about 4 hours a night, his appetite is good, his depression is moderate he rates it as a 7/10, he denies suicidal ideation or homicidal ideation. He reports no prior suicide attempts. He states he does have occasional spots, and visual hallucinations on withdrawal from alcohol. When he notes no auditory or visual hallucinations when not drinking. He rates his anxiety level as fairly moderate at a 7/10, and his stressors include being homeless and having multiple medical problems unemployed and no source of income. He has moderate feelings of hopelessness.  While a patient in this hospital, [name] received medication management for depression. They were ordered and received Prozac for that. They were also enrolled in group counseling sessions and activities in which they participated actively.   Patient attended  treatment team meeting this am and met with treatment team members. Pt symptoms, treatment plan and response to treatment discussed. Ms. Bedwell endorsed that their symptoms have improved. Pt also stated that they are stable for discharge.  They reported that from this hospital stay they had learned how to keep their cool when things get going and that they have got to stay clean.  In other to maintain depression and sobriety, they will continue psychiatric care on outpatient basis. They will follow-up at [facility] on 8/6 at 0800  Upon discharge, patient adamantly denies suicidal, homicidal ideations, auditory, visual hallucinations and or delusional thinking. They left [facility] with all personal belongings via personal transportation in no apparent distress.  Consults:  None  Significant Diagnostic Studies:  labs: Hemoglobin A1c elevated at 6.7, Phentoin level low at 3.3 and 9.0  Discharge Vitals:   Blood pressure 113/73, pulse 85, temperature 98.4 F (36.9 C), temperature source Oral, resp. rate 16, height 5' 4.17" (1.63 m), weight 59.875 kg (132 lb)..  Mental Status Exam: See Mental Status Examination and Suicide Risk Assessment completed by Attending Physician prior to discharge.  Discharge destination:  [facility]  Is patient on multiple antipsychotic therapies at discharge:  No  Has Patient had three or more failed trials of antipsychotic monotherapy by history: N/A Recommended Plan for Multiple Antipsychotic Therapies: N/A Discharge Orders    Future Orders Please Complete By Expires   Diet - low sodium heart healthy  Diet - low sodium heart healthy      Increase activity slowly      Discharge instructions      Comments:   Take all of your medications as prescribed.  Be sure to keep ALL follow up appointments as scheduled. This is to ensure getting your refills on time to avoid any interruption in your medication.  If you find that you can not keep your appointment,  call the clinic and reschedule. Be sure to tell the nurse if you will need a refill before your appointment.   Increase activity slowly      Discharge instructions      Comments:   Take all of your medications as prescribed.  Be sure to keep ALL follow up appointments as scheduled. This is to ensure getting your refills on time to avoid any interruption in your medication.  If you find that you can not keep your appointment, call the clinic and reschedule. Be sure to tell the nurse if you will need a refill before your appointment.     Medication List  As of 04/05/2012  6:07 PM   STOP taking these medications         hydrOXYzine 25 MG tablet         TAKE these medications      Indication    carvedilol 3.125 MG tablet   Commonly known as: COREG   Take 1 tablet (3.125 mg total) by mouth 2 (two) times daily with a meal. For blood pressure       FLUoxetine 20 MG capsule   Commonly known as: PROZAC   Take 1 capsule (20 mg total) by mouth daily. For depression       glipiZIDE 5 MG 24 hr tablet   Commonly known as: GLUCOTROL XL   Take 1 tablet (5 mg total) by mouth daily. For sugar    Indication: Type 2 Diabetes      metFORMIN 500 MG tablet   Commonly known as: GLUCOPHAGE   Take 1 tablet at breakfast and 2 tablets at supper for sugar    Indication: Type 2 Diabetes      phenytoin 100 MG ER capsule   Commonly known as: DILANTIN   Take 2 capsules (200 mg total) by mouth 2 (two) times daily. To prevent seizures    Indication: Seizure      quinapril 5 MG tablet   Commonly known as: ACCUPRIL   Take 1 tablet (5 mg total) by mouth daily. For blood pressure    Indication: High Blood Pressure           Follow-up Information    Follow up with [facility] on 03/29/2012. (Arrive there promptly at 8:00 am)    Contact information:   [address] [place], [place] [address] [phone]        Follow-up recommendations:   Activities: Resume typical activities Diet: Resume  typical diet Tests: none Other: Follow up with outpatient provider and report any side effects to out patient prescriber.  Comments:  Take all your medications as prescribed by your mental healthcare provider. Report any adverse effects and or reactions from your medicines to your outpatient provider promptly. Patient is instructed and cautioned to not engage in alcohol and or illegal drug use while on prescription medicines. In the event of worsening symptoms, patient is instructed to call the crisis hotline, 911 and or go to the nearest ED for appropriate evaluation and treatment of symptoms.  SignedDan Humphreys,  Sheniqua Carolan 04/05/2012 6:07 PM

## 2012-04-05 NOTE — ED Notes (Signed)
Pt reports a rash on legs x 2 weeks and hives on arms that started yesterday.

## 2012-04-18 ENCOUNTER — Emergency Department (HOSPITAL_BASED_OUTPATIENT_CLINIC_OR_DEPARTMENT_OTHER): Payer: Medicaid Other

## 2012-04-18 ENCOUNTER — Encounter (HOSPITAL_BASED_OUTPATIENT_CLINIC_OR_DEPARTMENT_OTHER): Payer: Self-pay | Admitting: *Deleted

## 2012-04-18 ENCOUNTER — Emergency Department (HOSPITAL_BASED_OUTPATIENT_CLINIC_OR_DEPARTMENT_OTHER)
Admission: EM | Admit: 2012-04-18 | Discharge: 2012-04-18 | Disposition: A | Discharge status: Home / Self Care | Payer: Medicaid Other | Attending: Emergency Medicine | Admitting: Emergency Medicine

## 2012-04-18 DIAGNOSIS — R05 Cough: Secondary | ICD-10-CM

## 2012-04-18 DIAGNOSIS — R059 Cough, unspecified: Secondary | ICD-10-CM | POA: Insufficient documentation

## 2012-04-18 DIAGNOSIS — J3489 Other specified disorders of nose and nasal sinuses: Secondary | ICD-10-CM | POA: Insufficient documentation

## 2012-04-18 DIAGNOSIS — R911 Solitary pulmonary nodule: Secondary | ICD-10-CM | POA: Insufficient documentation

## 2012-04-18 DIAGNOSIS — F1010 Alcohol abuse, uncomplicated: Secondary | ICD-10-CM | POA: Insufficient documentation

## 2012-04-18 DIAGNOSIS — Z79899 Other long term (current) drug therapy: Secondary | ICD-10-CM | POA: Insufficient documentation

## 2012-04-18 DIAGNOSIS — E7800 Pure hypercholesterolemia, unspecified: Secondary | ICD-10-CM | POA: Insufficient documentation

## 2012-04-18 DIAGNOSIS — G40909 Epilepsy, unspecified, not intractable, without status epilepticus: Secondary | ICD-10-CM | POA: Insufficient documentation

## 2012-04-18 DIAGNOSIS — I10 Essential (primary) hypertension: Secondary | ICD-10-CM | POA: Insufficient documentation

## 2012-04-18 MED ORDER — HYDROCHLOROTHIAZIDE 25 MG PO TABS: 25.0000 mg | ORAL_TABLET | Freq: Every day | ORAL | Status: DC

## 2012-04-18 NOTE — ED Notes (Signed)
Pt amb to triage with quick steady gait in nad. Pt reports 10 - 14 days of cough and congestion, taking otc cold medications with no improvement. Denies fevers.

## 2012-04-18 NOTE — ED Provider Notes (Signed)
History    This chart was scribed for Charles B. Bernette Mayers, MD, MD by Smitty Pluck. The patient was seen in room MH07 and the patient's care was started at 3:27PM.   CSN: [id number]  Arrival date & time 04/18/12  1356   First MD Initiated Contact with Patient 04/18/12 1526      Chief Complaint  Patient presents with  . Cough  . Nasal Congestion    (Consider location/radiation/quality/duration/timing/severity/associated sxs/prior treatment) Patient is a 51 y.o. male presenting with cough. The history is provided by the patient.  Cough   Jerry Small is a 51 y.o. male who presents to the Emergency Department complaining of constant, moderate non-productive cough and nasal congestion onset 10 days ago. Pt reports that he has taken OTC medication without relief. He reports that he has been taking Accupril the past year. Pt has been at [facility] for 20 days due to alcohol abuse. Pt reports that he has been feeling hot lately but is unsure if he has had a fever. Denies any other pain.   Pt goes to [facility] for medical treatment.   Past Medical History  Diagnosis Date  . Alcohol abuse   . Seizure   . Hypertension   . High cholesterol     Past Surgical History  Procedure Date  . Cholecystectomy     Family History  Problem Relation Age of Onset  . Hypertension Father   . Seizures Father     History  Substance Use Topics  . Smoking status: Never Smoker   . Smokeless tobacco: Never Used  . Alcohol Use: Yes     6 pack daily  last drink 1 week ago      Review of Systems  Respiratory: Positive for cough.   All other systems reviewed and are negative.   10 Systems reviewed and all are negative for acute change except as noted in the HPI.   Allergies  Review of patient's allergies indicates no known allergies.  Home Medications   Current Outpatient Rx  Name Route Sig Dispense Refill  . CARVEDILOL 3.125 MG PO TABS Oral Take 1 tablet (3.125 mg total) by mouth  2 (two) times daily with a meal. For blood pressure 60 tablet 0  . FLUOXETINE HCL 20 MG PO CAPS Oral Take 1 capsule (20 mg total) by mouth daily. For depression 30 capsule 0  . GLIPIZIDE ER 5 MG PO TB24 Oral Take 1 tablet (5 mg total) by mouth daily. For sugar 30 tablet 0  . METFORMIN HCL 500 MG PO TABS  Take 1 tablet at breakfast and 2 tablets at supper for sugar 90 tablet 0  . PHENYTOIN SODIUM EXTENDED 100 MG PO CAPS Oral Take 2 capsules (200 mg total) by mouth 2 (two) times daily. To prevent seizures 120 capsule 0  . PREDNISONE 20 MG PO TABS Oral Take 2 tablets (40 mg total) by mouth daily. 10 tablet 0  . QUINAPRIL HCL 5 MG PO TABS Oral Take 1 tablet (5 mg total) by mouth daily. For blood pressure 30 tablet 0    BP 131/84  Pulse 67  Temp 98.5 F (36.9 C) (Oral)  Resp 18  Ht 5\' 4"  (1.626 m)  Wt 140 lb (63.504 kg)  BMI 24.03 kg/m2  SpO2 100%  Physical Exam  Nursing note and vitals reviewed. Constitutional: He is oriented to person, place, and time. He appears well-developed and well-nourished.  HENT:  Head: Normocephalic and atraumatic.  Eyes: EOM are normal. Pupils  are equal, round, and reactive to light.  Neck: Normal range of motion. Neck supple.  Cardiovascular: Normal rate, normal heart sounds and intact distal pulses.   Pulmonary/Chest: Effort normal and breath sounds normal. No respiratory distress. He has no wheezes.  Abdominal: Bowel sounds are normal. He exhibits no distension. There is no tenderness.  Musculoskeletal: Normal range of motion. He exhibits no edema and no tenderness.  Neurological: He is alert and oriented to person, place, and time. He has normal strength. No cranial nerve deficit or sensory deficit.  Skin: Skin is warm and dry. No rash noted.  Psychiatric: He has a normal mood and affect.    ED Course  Procedures (including critical care time) DIAGNOSTIC STUDIES: Oxygen Saturation is 100% on room air, normal by my interpretation.    COORDINATION OF  CARE:    Labs Reviewed - No data to display Dg Chest 2 View  04/18/2012  *RADIOLOGY REPORT*  Clinical Data: Cough  CHEST - 2 VIEW  Comparison: None  Findings: Heart size and mediastinal contours are normal. There is a faint nodular density overlying the right upper lobe measuring approximately 8 mm.   No additional lung opacities identified. Visualized bony thorax appears intact. There are surgical clips noted within the right upper quadrant of the abdomen.  IMPRESSION:  1.  No acute cardiopulmonary abnormalities. 2.  Nodular density in the right upper lobe is indeterminate. Suggest further evaluation with noncontrast CT of the chest.   Original Report Authenticated By: Rosealee Albee, M.D.    Ct Chest Wo Contrast  04/18/2012  *RADIOLOGY REPORT*  Clinical Data: Lung nodule on chest radiograph.  Cough.  CT CHEST WITHOUT CONTRAST  Technique:  Multidetector CT imaging of the chest was performed following the standard protocol without IV contrast.  Comparison: Chest radiograph 04/18/2012.  Findings: No pathologically enlarged mediastinal or axillary lymph nodes.  Hilar regions are difficult to definitively evaluate without IV contrast.  Coronary artery calcification.  Heart size within normal limits.  A 3 mm nodule is seen in the right middle lobe (image 28).  No pleural fluid.  Airway is unremarkable.  Incidental imaging of the upper abdomen shows no acute findings.  A low attenuation lesion in the right kidney measures 2.4 cm, incompletely imaged.  There are age indeterminate compression fractures of the superior endplate of T10 as well as the body of T12.  Lack of surrounding paraspinal changes on axial imaging favors subacute or chronic findings.  There is a bone island in the anterolateral aspect of the right fourth rib.  IMPRESSION:  1.   A bone island in the right anterolateral 4th rib accounts for the questioned abnormality on today's chest radiograph. 2.  Tiny right middle lobe nodule. If the patient  is at high risk for bronchogenic carcinoma, follow-up chest CT at 1 year is recommended.  If the patient is at low risk, no follow-up is needed.  This recommendation follows the consensus statement: Guidelines for Management of Small Pulmonary Nodules Detected on CT Scans:  A Statement from the Fleischner Society as published in Radiology 2005; 237:395-400. 3.  Compression deformities of T10 and T12, as detailed above.   Original Report Authenticated By: Reyes Ivan, M.D.      No diagnosis found.    MDM  Imaging as above. Discussed results with the patient who states he has never smoked. Doubt this small nodule is of any clinical significance. Advised to followup with PCP for future surveillance. He is currently taking  Accupril (ACE Inhibitor) which is likely the source of his cough. Will stop this medication and add HCTZ for BP control.       I personally performed the services described in the documentation, which were scribed in my presence. The recorded information has been reviewed and considered.     Charles B. Bernette Mayers, MD 04/18/12 [id number]

## 2015-03-24 DIAGNOSIS — R569 Unspecified convulsions: Secondary | ICD-10-CM | POA: Insufficient documentation

## 2015-03-24 DIAGNOSIS — I10 Essential (primary) hypertension: Secondary | ICD-10-CM | POA: Insufficient documentation

## 2015-03-24 DIAGNOSIS — E119 Type 2 diabetes mellitus without complications: Secondary | ICD-10-CM | POA: Insufficient documentation

## 2015-10-30 ENCOUNTER — Emergency Department (HOSPITAL_BASED_OUTPATIENT_CLINIC_OR_DEPARTMENT_OTHER): Payer: Medicaid Other

## 2015-10-30 ENCOUNTER — Emergency Department (HOSPITAL_BASED_OUTPATIENT_CLINIC_OR_DEPARTMENT_OTHER)
Admission: EM | Admit: 2015-10-30 | Discharge: 2015-10-30 | Disposition: A | Discharge status: Home / Self Care | Payer: Medicaid Other | Attending: Emergency Medicine | Admitting: Emergency Medicine

## 2015-10-30 ENCOUNTER — Encounter (HOSPITAL_BASED_OUTPATIENT_CLINIC_OR_DEPARTMENT_OTHER): Payer: Self-pay

## 2015-10-30 DIAGNOSIS — E7800 Pure hypercholesterolemia, unspecified: Secondary | ICD-10-CM | POA: Diagnosis not present

## 2015-10-30 DIAGNOSIS — M25562 Pain in left knee: Secondary | ICD-10-CM | POA: Diagnosis not present

## 2015-10-30 DIAGNOSIS — J209 Acute bronchitis, unspecified: Secondary | ICD-10-CM | POA: Insufficient documentation

## 2015-10-30 DIAGNOSIS — R05 Cough: Secondary | ICD-10-CM | POA: Diagnosis present

## 2015-10-30 DIAGNOSIS — Z79899 Other long term (current) drug therapy: Secondary | ICD-10-CM | POA: Diagnosis not present

## 2015-10-30 DIAGNOSIS — I10 Essential (primary) hypertension: Secondary | ICD-10-CM | POA: Insufficient documentation

## 2015-10-30 DIAGNOSIS — J40 Bronchitis, not specified as acute or chronic: Secondary | ICD-10-CM

## 2015-10-30 MED ORDER — IBUPROFEN 800 MG PO TABS
800.0000 mg | ORAL_TABLET | Freq: Once | ORAL | Status: AC
Start: 2015-10-30 — End: 2015-10-30
  Administered 2015-10-30: 800 mg via ORAL
  Filled 2015-10-30: qty 1

## 2015-10-30 MED ORDER — AZITHROMYCIN 250 MG PO TABS: ORAL_TABLET | ORAL | Status: DC

## 2015-10-30 NOTE — ED Notes (Signed)
C/o cough x 1 week-also c/o injury to left knee 1 month ago-NAD-steady gait

## 2015-10-30 NOTE — Discharge Instructions (Signed)
Take zpack as prescribed.   Take tylenol, motrin for fever.   See orthopedic doctor for left knee pain.   See primary care doctor as well   Return to ER if you have fever for a week, trouble breathing, worse cough, worse knee pain.

## 2015-10-30 NOTE — ED Provider Notes (Signed)
CSN: [id number]     Arrival date & time 10/30/15  1540 History   First MD Initiated Contact with Patient 10/30/15 1705     Chief Complaint  Patient presents with  . Cough     (Consider location/radiation/quality/duration/timing/severity/associated sxs/prior Treatment) The history is provided by the patient.  Graig Hessling is a 55 y.o. male hx of seizure, HTN, here with cough, left knee pain, Subjective fevers. Patient states that he's been having subjective fevers for the last 3 weeks or so. Has a nonproductive cough as well. He states that he sometimes gets chills after he gets off work. He also fell and hit his left knee about month ago and it has been hurting but he's been able to walk on it. Patient states that he is switching primary care doctor and was unable to get in to see someone until next month so came here for evaluation.     Past Medical History  Diagnosis Date  . Alcohol abuse   . Seizure (HCC)   . Hypertension   . High cholesterol    Past Surgical History  Procedure Laterality Date  . Cholecystectomy     Family History  Problem Relation Age of Onset  . Hypertension Father   . Seizures Father    Social History  Substance Use Topics  . Smoking status: Never Smoker   . Smokeless tobacco: Never Used  . Alcohol Use: No    Review of Systems  Respiratory: Positive for cough.   Musculoskeletal:       L knee pain   All other systems reviewed and are negative.     Allergies  Review of patient's allergies indicates no known allergies.  Home Medications   Prior to Admission medications   Medication Sig Start Date End Date Taking? Authorizing Provider  furosemide (LASIX) 20 MG tablet Take 20 mg by mouth daily.   Yes Historical Provider, MD  lisinopril (PRINIVIL,ZESTRIL) 40 MG tablet Take 40 mg by mouth daily.   Yes Historical Provider, MD  omeprazole (PRILOSEC) 20 MG capsule Take 20 mg by mouth daily.   Yes Historical Provider, MD  oxybutynin (DITROPAN) 5 MG  tablet Take 5 mg by mouth 2 (two) times daily.   Yes Historical Provider, MD  pravastatin (PRAVACHOL) 10 MG tablet Take 10 mg by mouth daily.   Yes Historical Provider, MD  sitaGLIPtin (JANUVIA) 50 MG tablet Take 50 mg by mouth daily.   Yes Historical Provider, MD  traZODone (DESYREL) 100 MG tablet Take 100 mg by mouth at bedtime.   Yes Historical Provider, MD  Vitamin D, Ergocalciferol, (DRISDOL) 50000 units CAPS capsule Take 50,000 Units by mouth every 7 (seven) days.   Yes Historical Provider, MD  phenytoin (DILANTIN) 100 MG ER capsule Take 300 mg by mouth 2 (two) times daily. To prevent seizures 03/28/12   Rona Ravens Mashburn, PA-C   BP 122/77 mmHg  Pulse 85  Temp(Src) 99.7 F (37.6 C) (Oral)  Resp 18  Ht  (1.6 m)  Wt 140 lb (63.504 kg)  BMI 24.81 kg/m2  SpO2 96% Physical Exam  Constitutional: He is oriented to person, place, and time. He appears well-developed and well-nourished.  HENT:  Head: Normocephalic.  Mouth/Throat: Oropharynx is clear and moist.  Eyes: Conjunctivae are normal. Pupils are equal, round, and reactive to light.  Neck: Normal range of motion. Neck supple.  Cardiovascular: Normal rate, regular rhythm and normal heart sounds.   Pulmonary/Chest: Effort normal.  Diminished throughout. No wheezing  Abdominal: Soft. Bowel sounds are normal. He exhibits no distension. There is no tenderness. There is no rebound.  Musculoskeletal: Normal range of motion.  L knee with mild tenderness medial aspect. No effusion   Neurological: He is alert and oriented to person, place, and time.  Skin: Skin is warm and dry.  Psychiatric: He has a normal mood and affect. His behavior is normal. Judgment and thought content normal.  Nursing note and vitals reviewed.   ED Course  Procedures (including critical care time) Labs Review Labs Reviewed - No data to display  Imaging Review Dg Chest 2 View  10/30/2015  CLINICAL DATA:  Nonproductive cough with fever for 3 weeks. EXAM:  CHEST  2 VIEW COMPARISON:  02/06/2014.  CT scan from 04/18/2012. FINDINGS: The lungs are clear wiithout focal pneumonia, edema, pneumothorax or pleural effusion. Nodular density projecting over the anterior right fourth rib was seen to represent bone island on previous CT scan. The cardiopericardial silhouette is within normal limits for size. The visualized bony structures of the thorax are intact. IMPRESSION: No active cardiopulmonary disease. Electronically Signed   By: Kennith CenterEric  Mansell M.D.   On: 10/30/2015 17:59   Dg Knee Complete 4 Views Left  10/30/2015  CLINICAL DATA:  Left knee injury 1 month ago, medial left knee pain EXAM: LEFT KNEE - COMPLETE 4+ VIEW COMPARISON:  None. FINDINGS: Four views of the left knee submitted. No acute fracture or subluxation. Minimal narrowing of medial joint compartment. No joint effusion. Extensive atherosclerotic calcifications of femoral popliteal and peroneal artery. Mild focal soft tissue swelling adjacent to medial aspect of the knee IMPRESSION: No acute fracture or subluxation. Minimal narrowing of medial joint compartment. Atherosclerotic vascular calcifications are noted. Mild focal soft tissue swelling adjacent to medial aspect of the joint. Please see oblique view. Electronically Signed   By: Natasha MeadLiviu  Pop M.D.   On: 10/30/2015 18:01   I have personally reviewed and evaluated these images and lab results as part of my medical decision-making.   EKG Interpretation None      MDM   Final diagnoses:  None   Delanna NoticeVerdie Laflam is a 55 y.o. male here with L knee pain, cough, low grade temp. Consider viral syndrome vs pneumonia. L knee pain likely meniscus injury vs contusion. Will get CXR, L knee xray.    6:41 PM CXR clear. Xrays L knee showed some arthritis. Given low grade temp, will give zpack for possible bronchitis. Will refer to ortho for L knee pain.     Richardean Canalavid H Ginger Leeth, MD 10/30/15 [phone]

## 2015-10-30 NOTE — ED Notes (Signed)
Patient transported to radiology department via stretcher. 

## 2015-11-06 ENCOUNTER — Encounter (HOSPITAL_BASED_OUTPATIENT_CLINIC_OR_DEPARTMENT_OTHER): Payer: Self-pay

## 2015-11-06 ENCOUNTER — Emergency Department (HOSPITAL_BASED_OUTPATIENT_CLINIC_OR_DEPARTMENT_OTHER)
Admission: EM | Admit: 2015-11-06 | Discharge: 2015-11-06 | Disposition: A | Discharge status: Home / Self Care | Payer: Medicaid Other | Attending: Emergency Medicine | Admitting: Emergency Medicine

## 2015-11-06 DIAGNOSIS — E7800 Pure hypercholesterolemia, unspecified: Secondary | ICD-10-CM | POA: Diagnosis not present

## 2015-11-06 DIAGNOSIS — Z79899 Other long term (current) drug therapy: Secondary | ICD-10-CM | POA: Insufficient documentation

## 2015-11-06 DIAGNOSIS — J069 Acute upper respiratory infection, unspecified: Secondary | ICD-10-CM

## 2015-11-06 DIAGNOSIS — M25562 Pain in left knee: Secondary | ICD-10-CM | POA: Diagnosis not present

## 2015-11-06 DIAGNOSIS — I10 Essential (primary) hypertension: Secondary | ICD-10-CM | POA: Insufficient documentation

## 2015-11-06 DIAGNOSIS — Z09 Encounter for follow-up examination after completed treatment for conditions other than malignant neoplasm: Secondary | ICD-10-CM | POA: Diagnosis present

## 2015-11-06 MED ORDER — FLUTICASONE PROPIONATE 50 MCG/ACT NA SUSP: 2.0000 | Freq: Every day | NASAL | Status: AC

## 2015-11-06 MED ORDER — CETIRIZINE HCL 10 MG PO TABS: 10.0000 mg | ORAL_TABLET | Freq: Every day | ORAL | Status: AC

## 2015-11-06 MED ORDER — TRIAMCINOLONE ACETONIDE 40 MG/ML IJ SUSP
40.0000 mg | Freq: Once | INTRAMUSCULAR | Status: AC
  Administered 2015-11-06: 40 mg via INTRAMUSCULAR
  Filled 2015-11-06: qty 5

## 2015-11-06 MED ORDER — BENZONATATE 100 MG PO CAPS: 100.0000 mg | ORAL_CAPSULE | Freq: Three times a day (TID) | ORAL | Status: DC | PRN

## 2015-11-06 MED ORDER — BUPIVACAINE HCL 0.5 % IJ SOLN
50.0000 mL | Freq: Once | INTRAMUSCULAR | Status: AC
  Administered 2015-11-06: 50 mL
  Filled 2015-11-06: qty 1

## 2015-11-06 MED FILL — FLUTICASONE PROP 50 MCG SPR: 50 | 30 days supply | Qty: 16 | Fill #0

## 2015-11-06 MED FILL — ALL DAY ALLERGY 10 MG TAB: 10 | 100 days supply | Qty: 100 | Fill #0

## 2015-11-06 MED FILL — BENZONATATE 100 MG CAPSULE: 100 | 7 days supply | Qty: 21 | Fill #0

## 2015-11-06 NOTE — ED Provider Notes (Signed)
CSN: [id number]     Arrival date & time 11/06/15  1542 History   First MD Initiated Contact with Patient 11/06/15 1605     Chief Complaint  Patient presents with  . Follow-up    Jerry Small is a 55 y.o. male who presents to the ED complaining of URI symptoms and pain to his left knee. Patient reports cough, sneezing, runny nose, post nasal drip and nasal congestion for about a week and a half. Patient has a chest x-ray one week ago which showed no acute pulmonary disease. The patient completed azithromycin pack 5 days ago without relief. Patient still reports sneezing, nasal congestion, runny nose and postnasal drip. No shortness of breath or trouble breathing. No fevers. He reports hitting his left knee 3 weeks ago and had an x-ray last week which showed arthritis. He is still complaining of left knee pain with movement. His pain is worse medially. He denies fevers, numbness, tingling, weakness, chest pain, shortness of breath, abdominal pain, nausea or vomiting.  HPI  Past Medical History  Diagnosis Date  . Alcohol abuse   . Seizure (HCC)   . Hypertension   . High cholesterol    Past Surgical History  Procedure Laterality Date  . Cholecystectomy     Family History  Problem Relation Age of Onset  . Hypertension Father   . Seizures Father    Social History  Substance Use Topics  . Smoking status: Never Smoker   . Smokeless tobacco: Never Used  . Alcohol Use: No    Review of Systems  Constitutional: Negative for fever and chills.  HENT: Positive for rhinorrhea and sneezing. Negative for congestion, sore throat and trouble swallowing.   Eyes: Negative for visual disturbance.  Respiratory: Positive for cough. Negative for shortness of breath and wheezing.   Cardiovascular: Negative for chest pain and palpitations.  Gastrointestinal: Negative for nausea, vomiting, abdominal pain and diarrhea.  Genitourinary: Negative for dysuria.  Musculoskeletal: Positive for arthralgias.  Negative for back pain and neck pain.  Skin: Negative for rash.  Neurological: Negative for weakness, numbness and headaches.      Allergies  Review of patient's allergies indicates no known allergies.  Home Medications   Prior to Admission medications   Medication Sig Start Date End Date Taking? Authorizing Provider  furosemide (LASIX) 20 MG tablet Take 20 mg by mouth daily.   Yes Historical Provider, MD  lisinopril (PRINIVIL,ZESTRIL) 40 MG tablet Take 40 mg by mouth daily.   Yes Historical Provider, MD  omeprazole (PRILOSEC) 20 MG capsule Take 20 mg by mouth daily.   Yes Historical Provider, MD  oxybutynin (DITROPAN) 5 MG tablet Take 5 mg by mouth 2 (two) times daily.   Yes Historical Provider, MD  phenytoin (DILANTIN) 100 MG ER capsule Take 300 mg by mouth 2 (two) times daily. To prevent seizures 03/28/12  Yes Rona RavensNeil T Mashburn, PA-C  pravastatin (PRAVACHOL) 10 MG tablet Take 10 mg by mouth daily.   Yes Historical Provider, MD  sitaGLIPtin (JANUVIA) 50 MG tablet Take 50 mg by mouth daily.   Yes Historical Provider, MD  traZODone (DESYREL) 100 MG tablet Take 100 mg by mouth at bedtime.   Yes Historical Provider, MD  Vitamin D, Ergocalciferol, (DRISDOL) 50000 units CAPS capsule Take 50,000 Units by mouth every 7 (seven) days.   Yes Historical Provider, MD  azithromycin (ZITHROMAX Z-PAK) 250 MG tablet 2 po day one, then 1 daily x 4 days 10/30/15   Richardean Canalavid H Yao, MD  benzonatate (  TESSALON) 100 MG capsule Take 1 capsule (100 mg total) by mouth 3 (three) times daily as needed for cough. 11/06/15   Everlene Farrier, PA-C  cetirizine (ZYRTEC ALLERGY) 10 MG tablet Take 1 tablet (10 mg total) by mouth daily. 11/06/15   Everlene Farrier, PA-C  fluticasone (FLONASE) 50 MCG/ACT nasal spray Place 2 sprays into both nostrils daily. 11/06/15   Everlene Farrier, PA-C   BP 125/77 mmHg  Pulse 53  Temp(Src) 98.3 F (36.8 C) (Oral)  Resp 16  Ht  (1.6 m)  Wt 69.4 kg  BMI 27.11 kg/m2  SpO2 100% Physical Exam   Constitutional: He appears well-developed and well-nourished. No distress.  Nontoxic appearing.  HENT:  Head: Normocephalic and atraumatic.  Right Ear: External ear normal.  Left Ear: External ear normal.  Mouth/Throat: Oropharynx is clear and moist. No oropharyngeal exudate.  Bilateral tympanic membranes are pearly-gray without erythema or loss of landmarks.  No tonsillar hypertrophy or exudates. Boggy nasal turbinates bilaterally.  Eyes: Conjunctivae are normal. Pupils are equal, round, and reactive to light. Right eye exhibits no discharge. Left eye exhibits no discharge.  Neck: Neck supple.  Cardiovascular: Normal rate, regular rhythm, normal heart sounds and intact distal pulses.  Exam reveals no gallop and no friction rub.   No murmur heard. Bilateral dorsalis pedis and posterior tibialis pulses are intact. Good capillary refill to his left distal toes.  Pulmonary/Chest: Effort normal and breath sounds normal. No respiratory distress. He has no wheezes. He has no rales.  Lungs are clear to auscultation bilaterally.  Abdominal: Soft. He exhibits no distension. There is no tenderness. There is no guarding.  Musculoskeletal: Normal range of motion. He exhibits no edema or tenderness.  No tenderness to palpation of his left knee. Patient reports some pain with range of motion. Patient has good full range of motion of his left knee. No knee joint effusion or erythema. No calf edema or tenderness bilaterally.  Lymphadenopathy:    He has no cervical adenopathy.  Neurological: He is alert. Coordination normal.  Sensation is intact in his bilateral lower extremities.  Skin: Skin is warm and dry. No rash noted. He is not diaphoretic. No erythema. No pallor.  Psychiatric: He has a normal mood and affect. His behavior is normal.  Nursing note and vitals reviewed.   ED Course  Injection of joint Date/Time: 11/06/2015 5:00 PM Performed by: Rolland Porter Authorized by: Rolland Porter Consent:  Verbal consent obtained. Risks and benefits: risks, benefits and alternatives were discussed Consent given by: patient Patient understanding: patient states understanding of the procedure being performed Patient consent: the patient's understanding of the procedure matches consent given Procedure consent: procedure consent matches procedure scheduled Relevant documents: relevant documents present and verified Test results: test results available and properly labeled Site marked: the operative site was marked Imaging studies: imaging studies available Required items: required blood products, implants, devices, and special equipment available Patient identity confirmed: verbally with patient Time out: Immediately prior to procedure a "time out" was called to verify the correct patient, procedure, equipment, support staff and site/side marked as required. Preparation: Patient was prepped and draped in the usual sterile fashion. Local anesthesia used: yes Local anesthetic: bupivacaine 0.25% without epinephrine Anesthetic total: 1 ml Patient sedated: no Patient tolerance: Patient tolerated the procedure well with no immediate complications Comments: Joint injection with kenalog and marcaine. Patient tolerated procedure well.    (including critical care time) Labs Review Labs Reviewed - No data to display  Imaging Review  No results found.    EKG Interpretation None      Filed Vitals:   11/06/15 1549 11/06/15 1712  BP: 138/71 125/77  Pulse: 72 53  Temp: 98.3 F (36.8 C)   TempSrc: Oral   Resp: 16 16  Height:  (1.6 m)   Weight: 69.4 kg   SpO2: 100% 100%     MDM   Meds given in ED:  Medications  triamcinolone acetonide (KENALOG-40) injection 40 mg (40 mg Intramuscular Given 11/06/15 1641)  bupivacaine (MARCAINE) 0.5 % (with pres) injection 50 mL (50 mLs Infiltration Given 11/06/15 1641)    Discharge Medication List as of 11/06/2015  5:08 PM    START taking these  medications   Details  benzonatate (TESSALON) 100 MG capsule Take 1 capsule (100 mg total) by mouth 3 (three) times daily as needed for cough., Starting 11/06/2015, Until Discontinued, Print    cetirizine (ZYRTEC ALLERGY) 10 MG tablet Take 1 tablet (10 mg total) by mouth daily., Starting 11/06/2015, Until Discontinued, Print    fluticasone (FLONASE) 50 MCG/ACT nasal spray Place 2 sprays into both nostrils daily., Starting 11/06/2015, Until Discontinued, Print        Final diagnoses:  Left knee pain  URI (upper respiratory infection)   This  is a 55 y.o. male who presents to the ED complaining of URI symptoms and pain to his left knee. Patient reports cough, sneezing, runny nose, post nasal drip and nasal congestion for about a week and a half. Patient has a chest x-ray one week ago which showed no acute pulmonary disease. The patient completed azithromycin pack 5 days ago without relief. Patient still reports sneezing, nasal congestion, runny nose and postnasal drip. No shortness of breath or trouble breathing. No fevers. He reports hitting his left knee 3 weeks ago and had an x-ray last week which showed arthritis. He is still complaining of left knee pain with movement. His pain is worse medially. On exam the patient is afebrile nontoxic appearing. His lungs are clear to auscultation bilaterally. Patient has some pain with range of motion of his left knee. No knee joint effusion or erythema. No concern for septic joint. Dr. Fayrene Fearing performed a joint injection of his left knee with kenalog and marcaine. The patient tolerated the procedure well. I encouraged him to follow-up with sports medicine physician Dr. Pearletha Forge. Will start the patient on Zyrtec, Tessalon Perles and Flonase for his upper respiratory infection. I advised the patient to follow-up with their primary care provider this week. I advised the patient to return to the emergency department with new or worsening symptoms or new concerns. The  patient verbalized understanding and agreement with plan.         Everlene Farrier, PA-C 11/07/15 0022  Rolland Porter, MD 11/16/15 [phone]

## 2015-11-06 NOTE — Discharge Instructions (Signed)
Upper Respiratory Infection, Adult Most upper respiratory infections (URIs) are a viral infection of the air passages leading to the lungs. A URI affects the nose, throat, and upper air passages. The most common type of URI is nasopharyngitis and is typically referred to as "the common cold." URIs run their course and usually go away on their own. Most of the time, a URI does not require medical attention, but sometimes a bacterial infection in the upper airways can follow a viral infection. This is called a secondary infection. Sinus and middle ear infections are common types of secondary upper respiratory infections. Bacterial pneumonia can also complicate a URI. A URI can worsen asthma and chronic obstructive pulmonary disease (COPD). Sometimes, these complications can require emergency medical care and may be life threatening.  CAUSES Almost all URIs are caused by viruses. A virus is a type of germ and can spread from one person to another.  RISKS FACTORS You may be at risk for a URI if:   You smoke.   You have chronic heart or lung disease.  You have a weakened defense (immune) system.   You are very young or very old.   You have nasal allergies or asthma.  You work in crowded or poorly ventilated areas.  You work in health care facilities or schools. SIGNS AND SYMPTOMS  Symptoms typically develop 2-3 days after you come in contact with a cold virus. Most viral URIs last 7-10 days. However, viral URIs from the influenza virus (flu virus) can last 14-18 days and are typically more severe. Symptoms may include:   Runny or stuffy (congested) nose.   Sneezing.   Cough.   Sore throat.   Headache.   Fatigue.   Fever.   Loss of appetite.   Pain in your forehead, behind your eyes, and over your cheekbones (sinus pain).  Muscle aches.  DIAGNOSIS  Your health care provider may diagnose a URI by:  Physical exam.  Tests to check that your symptoms are not due to  another condition such as:  Strep throat.  Sinusitis.  Pneumonia.  Asthma. TREATMENT  A URI goes away on its own with time. It cannot be cured with medicines, but medicines may be prescribed or recommended to relieve symptoms. Medicines may help:  Reduce your fever.  Reduce your cough.  Relieve nasal congestion. HOME CARE INSTRUCTIONS   Take medicines only as directed by your health care provider.   Gargle warm saltwater or take cough drops to comfort your throat as directed by your health care provider.  Use a warm mist humidifier or inhale steam from a shower to increase air moisture. This may make it easier to breathe.  Drink enough fluid to keep your urine clear or pale yellow.   Eat soups and other clear broths and maintain good nutrition.   Rest as needed.   Return to work when your temperature has returned to normal or as your health care provider advises. You may need to stay home longer to avoid infecting others. You can also use a face mask and careful hand washing to prevent spread of the virus.  Increase the usage of your inhaler if you have asthma.   Do not use any tobacco products, including cigarettes, chewing tobacco, or electronic cigarettes. If you need help quitting, ask your health care provider. PREVENTION  The best way to protect yourself from getting a cold is to practice good hygiene.   Avoid oral or hand contact with people with cold  symptoms.   Wash your hands often if contact occurs.  There is no clear evidence that vitamin C, vitamin E, echinacea, or exercise reduces the chance of developing a cold. However, it is always recommended to get plenty of rest, exercise, and practice good nutrition.  SEEK MEDICAL CARE IF:   You are getting worse rather than better.   Your symptoms are not controlled by medicine.   You have chills.  You have worsening shortness of breath.  You have brown or red mucus.  You have yellow or brown nasal  discharge.  You have pain in your face, especially when you bend forward.  You have a fever.  You have swollen neck glands.  You have pain while swallowing.  You have white areas in the back of your throat. SEEK IMMEDIATE MEDICAL CARE IF:   You have severe or persistent:  Headache.  Ear pain.  Sinus pain.  Chest pain.  You have chronic lung disease and any of the following:  Wheezing.  Prolonged cough.  Coughing up blood.  A change in your usual mucus.  You have a stiff neck.  You have changes in your:  Vision.  Hearing.  Thinking.  Mood. MAKE SURE YOU:   Understand these instructions.  Will watch your condition.  Will get help right away if you are not doing well or get worse.   This information is not intended to replace advice given to you by your health care provider. Make sure you discuss any questions you have with your health care provider.   Document Released: 02/03/2001 Document Revised: 12/25/2014 Document Reviewed: 11/15/2013 Elsevier Interactive Patient Education 2016 [employer] Knee Pain Knee pain is a very common symptom and can have many causes. Knee pain often goes away when you follow your health care provider's instructions for relieving pain and discomfort at home. However, knee pain can develop into a condition that needs treatment. Some conditions may include:  Arthritis caused by wear and tear (osteoarthritis).  Arthritis caused by swelling and irritation (rheumatoid arthritis or gout).  A cyst or growth in your knee.  An infection in your knee joint.  An injury that will not heal.  Damage, swelling, or irritation of the tissues that support your knee (torn ligaments or tendinitis). If your knee pain continues, additional tests may be ordered to diagnose your condition. Tests may include X-rays or other imaging studies of your knee. You may also need to have fluid removed from your knee. Treatment for ongoing knee pain  depends on the cause, but treatment may include:  Medicines to relieve pain or swelling.  Steroid injections in your knee.  Physical therapy.  Surgery. HOME CARE INSTRUCTIONS  Take medicines only as directed by your health care provider.  Rest your knee and keep it raised (elevated) while you are resting.  Do not do things that cause or worsen pain.  Avoid high-impact activities or exercises, such as running, jumping rope, or doing jumping jacks.  Apply ice to the knee area:  Put ice in a plastic bag.  Place a towel between your skin and the bag.  Leave the ice on for 20 minutes, 2-3 times a day.  Ask your health care provider if you should wear an elastic knee support.  Keep a pillow under your knee when you sleep.  Lose weight if you are overweight. Extra weight can put pressure on your knee.  Do not use any tobacco products, including cigarettes, chewing tobacco, or electronic cigarettes. If  you need help quitting, ask your health care provider. Smoking may slow the healing of any bone and joint problems that you may have. SEEK MEDICAL CARE IF:  Your knee pain continues, changes, or gets worse.  You have a fever along with knee pain.  Your knee buckles or locks up.  Your knee becomes more swollen. SEEK IMMEDIATE MEDICAL CARE IF:   Your knee joint feels hot to the touch.  You have chest pain or trouble breathing.   This information is not intended to replace advice given to you by your health care provider. Make sure you discuss any questions you have with your health care provider.   Document Released: 06/07/2007 Document Revised: 08/31/2014 Document Reviewed: 03/26/2014 Elsevier Interactive Patient Education [employer] Inc[employer]

## 2015-11-06 NOTE — ED Notes (Addendum)
Pt here for follow up from ED visit last week states he was told to come back here for follow up in 1 week - seen for left knee pain, cough, dx with Bronchitis and Arthritis. Pt states left knee continues to hurt and he needs a "cortisone shot and im spitting up cold now." Pt has productive cough with thick yellow-green sputum. Pt completed Z pak on Saturday.

## 2015-11-13 ENCOUNTER — Encounter: Payer: Self-pay | Admitting: Family Medicine

## 2015-11-13 ENCOUNTER — Ambulatory Visit (INDEPENDENT_AMBULATORY_CARE_PROVIDER_SITE_OTHER): Payer: Medicaid Other | Admitting: Family Medicine

## 2015-11-13 VITALS — BP 153/94 | HR 64 | Ht 63.0 in | Wt 135.0 lb

## 2015-11-13 DIAGNOSIS — M25562 Pain in left knee: Secondary | ICD-10-CM

## 2015-11-13 MED ORDER — HYDROCODONE-ACETAMINOPHEN 5-325 MG PO TABS: 1.0000 | ORAL_TABLET | Freq: Four times a day (QID) | ORAL | Status: AC | PRN

## 2015-11-13 NOTE — Patient Instructions (Signed)
Your pain is due to a severe arthritis flare, less likely a meniscus or articular cartilage tear. Take tylenol OR hydrocodone as needed for pain. Aleve 1-2 tabs twice a day with food Glucosamine sulfate 750mg  twice a day is a supplement that may help. Capsaicin, aspercreme, or biofreeze topically up to four times a day may also help with pain. Cortisone injections are an option - you were given this today. It's important that you continue to stay active. Straight leg raises, knee extensions 3 sets of 10 once a day (add ankle weight if these become too easy). Consider physical therapy to strengthen muscles around the joint that hurts to take pressure off of the joint itself. Shoe inserts with good arch support may be helpful. Walker or cane if needed. Heat or ice 15 minutes at a time 3-4 times a day as needed to help with pain. Call me early next week to let me know how you're doing.

## 2015-11-14 DIAGNOSIS — M25562 Pain in left knee: Secondary | ICD-10-CM | POA: Diagnosis not present

## 2015-11-14 MED ORDER — METHYLPREDNISOLONE ACETATE 40 MG/ML IJ SUSP
40.0000 mg | Freq: Once | INTRAMUSCULAR | Status: AC
  Administered 2015-11-14: 40 mg via INTRA_ARTICULAR

## 2015-11-15 DIAGNOSIS — M25562 Pain in left knee: Secondary | ICD-10-CM | POA: Insufficient documentation

## 2015-11-15 NOTE — Progress Notes (Signed)
PCP: No primary care provider on file.  Subjective:   HPI: Patient is a 55 y.o. male here for left knee pain.  Patient reports having 3 months of anterior left knee pain. Struck knee on a stool prior to this. Was aching before this injury though. Pain is 5/10, up to 10/10 level, sharp. Worse with walking. Using a topical medication, aleve. Given intraarticular injection in ED - anterolateral approach. + swelling. No skin changes, fever.  Past Medical History  Diagnosis Date  . Alcohol abuse   . Seizure (HCC)   . Hypertension   . High cholesterol     Current Outpatient Prescriptions on File Prior to Visit  Medication Sig Dispense Refill  . cetirizine (ZYRTEC ALLERGY) 10 MG tablet Take 1 tablet (10 mg total) by mouth daily. 30 tablet 1  . fluticasone (FLONASE) 50 MCG/ACT nasal spray Place 2 sprays into both nostrils daily. 16 g 0  . furosemide (LASIX) 20 MG tablet Take 20 mg by mouth daily.    Marland Kitchen lisinopril (PRINIVIL,ZESTRIL) 40 MG tablet Take 40 mg by mouth daily.    Marland Kitchen omeprazole (PRILOSEC) 20 MG capsule Take 20 mg by mouth daily.    Marland Kitchen oxybutynin (DITROPAN) 5 MG tablet Take 5 mg by mouth 2 (two) times daily.    . phenytoin (DILANTIN) 100 MG ER capsule Take 300 mg by mouth 2 (two) times daily. To prevent seizures    . pravastatin (PRAVACHOL) 10 MG tablet Take 10 mg by mouth daily.    . sitaGLIPtin (JANUVIA) 50 MG tablet Take 50 mg by mouth daily.    . traZODone (DESYREL) 100 MG tablet Take 100 mg by mouth at bedtime.    . Vitamin D, Ergocalciferol, (DRISDOL) 50000 units CAPS capsule Take 50,000 Units by mouth every 7 (seven) days.    . [DISCONTINUED] quinapril (ACCUPRIL) 5 MG tablet Take 5 mg by mouth daily. For blood pressure     No current facility-administered medications on file prior to visit.    Past Surgical History  Procedure Laterality Date  . Cholecystectomy      No Known Allergies  Social History   Social History  . Marital Status: Single    Spouse Name:  N/A  . Number of Children: N/A  . Years of Education: N/A   Occupational History  . Not on file.   Social History Main Topics  . Smoking status: Never Smoker   . Smokeless tobacco: Never Used  . Alcohol Use: No  . Drug Use: No  . Sexual Activity: Not on file   Other Topics Concern  . Not on file   Social History Narrative    Family History  Problem Relation Age of Onset  . Hypertension Father   . Seizures Father     BP 153/94 mmHg  Pulse 64  Ht  (1.6 m)  Wt 135 lb (61.236 kg)  BMI 23.92 kg/m2  Review of Systems: See HPI above.    Objective:  Physical Exam:  Gen: NAD, comfortable in exam room  Left knee: No gross deformity, ecchymoses, effusion. Medial joint line tenderness.  No other tenderness. FROM. Negative ant/post drawers. Negative valgus/varus testing. Negative lachmanns. Negative mcmurrays, apleys, patellar apprehension. NV intact distally.    Assessment & Plan:  1. Left knee pain - independently reviewed radiographs - mild DJD mainly medial though images are not weight bearing.  Discussed tylenol, nsaids, glucosamine, topical medications.  Shown home exercises to do daily.  Arch supports, heat/ice.  Repeated intraarticular injection  with anteromedial approach instead - advised would not do a third injection.  Call us in a week for an update on his status.  Will consider MRI in future if not improving.  After informed written consent, patient was seated on exam table. Left knee was prepped with alcohol swab and utilizing anteromedial approach, patient's left knee was injected intraarticularly with 3:1 marcaine: depomedrol. Patient tolerated the procedure well without immediate complications.

## 2015-11-15 NOTE — Assessment & Plan Note (Signed)
independently reviewed radiographs - mild DJD mainly medial though images are not weight bearing.  Discussed tylenol, nsaids, glucosamine, topical medications.  Shown home exercises to do daily.  Arch supports, heat/ice.  Repeated intraarticular injection with anteromedial approach instead - advised would not do a third injection.  Call us in a week for an update on his status.  Will consider MRI in future if not improving.  After informed written consent, patient was seated on exam table. Left knee was prepped with alcohol swab and utilizing anteromedial approach, patient's left knee was injected intraarticularly with 3:1 marcaine: depomedrol. Patient tolerated the procedure well without immediate complications.

## 2016-06-30 ENCOUNTER — Ambulatory Visit (INDEPENDENT_AMBULATORY_CARE_PROVIDER_SITE_OTHER): Payer: Medicaid Other | Admitting: Family Medicine

## 2016-06-30 ENCOUNTER — Encounter: Payer: Self-pay | Admitting: Family Medicine

## 2016-06-30 VITALS — BP 191/83 | HR 53 | Ht 63.0 in | Wt 152.0 lb

## 2016-06-30 DIAGNOSIS — G8929 Other chronic pain: Secondary | ICD-10-CM

## 2016-06-30 DIAGNOSIS — M25562 Pain in left knee: Secondary | ICD-10-CM

## 2016-06-30 MED ORDER — METHYLPREDNISOLONE ACETATE 40 MG/ML IJ SUSP
40.0000 mg | Freq: Once | INTRAMUSCULAR | Status: AC
  Administered 2016-06-30: 40 mg via INTRA_ARTICULAR

## 2016-06-30 NOTE — Patient Instructions (Signed)
Your pain is due to arthritis vs a meniscus tear. Both are treated similarly initially. Take tylenol 500mg  1-2 tabs three times a day as needed for pain. Aleve 1-2 tabs twice a day with food as needed Glucosamine sulfate 750mg  twice a day is a supplement that may help. Capsaicin, aspercreme, or biofreeze topically up to four times a day may also help with pain. Cortisone injections are an option - you were given this today. Get the cone coverage paperwork filled out and sent in - if not improving we can then consider an MRI or physical therapy if you have this. It's important that you continue to stay active. Straight leg raises, knee extensions 3 sets of 10 once a day (add ankle weight if these become too easy). Consider physical therapy to strengthen muscles around the joint that hurts to take pressure off of the joint itself. Shoe inserts with good arch support may be helpful. Walker or cane if needed. Heat or ice 15 minutes at a time 3-4 times a day as needed to help with pain. Follow up with me in 1 month.

## 2016-07-02 NOTE — Assessment & Plan Note (Signed)
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  Discussed tylenol, nsaids, glucosamine, topical medications.  Shown home exercises to do daily.  He wanted to repeat injection so this was done today. Arch supports, heat/ice.  He will work on getting cone coverage and will consider physical therapy, MRI after that if still not improving.  After informed written consent, patient was lying supine on exam table. Left knee was prepped with alcohol swab and utilizing superolateral approach with ultrasound guidance, patient's left knee was injected intraarticularly with 3:1 marcaine: depomedrol. Patient tolerated the procedure well without immediate complications.

## 2016-07-02 NOTE — Progress Notes (Signed)
PCP: No primary care provider on file.  Subjective:   HPI: Patient is a 55 y.o. male here for left knee pain.  3/22: Patient reports having 3 months of anterior left knee pain. Struck knee on a stool prior to this. Was aching before this injury though. Pain is 5/10, up to 10/10 level, sharp. Worse with walking. Using a topical medication, aleve. Given intraarticular injection in ED - anterolateral approach. + swelling. No skin changes, fever.  11/7: Patient returns as left knee has flared up again. Worse if he stands for a long time. Pain level 8/10, sharp, anterior knee. Unable to lie down on left side. Tried tylenol, some old norco, and takes asa 81mg  daily. Mild swelling. No skin changes, numbness.  Past Medical History:  Diagnosis Date  . Alcohol abuse   . High cholesterol   . Hypertension   . Seizure [facility](HCC)     Current Outpatient Prescriptions on File Prior to Visit  Medication Sig Dispense Refill  . cetirizine (ZYRTEC ALLERGY) 10 MG tablet Take 1 tablet (10 mg total) by mouth daily. 30 tablet 1  . fluticasone (FLONASE) 50 MCG/ACT nasal spray Place 2 sprays into both nostrils daily. 16 g 0  . furosemide (LASIX) 20 MG tablet Take 20 mg by mouth daily.    Marland Kitchen. glipiZIDE (GLUCOTROL XL) 5 MG 24 hr tablet Take 5 mg by mouth daily.  1  . HYDROcodone-acetaminophen (NORCO) 5-325 MG tablet Take 1 tablet by mouth every 6 (six) hours as needed for moderate pain. 40 tablet 0  . lisinopril (PRINIVIL,ZESTRIL) 40 MG tablet Take 40 mg by mouth daily.    Marland Kitchen. omeprazole (PRILOSEC) 20 MG capsule Take 20 mg by mouth daily.    Marland Kitchen. oxybutynin (DITROPAN) 5 MG tablet Take 5 mg by mouth 2 (two) times daily.    . phenytoin (DILANTIN) 100 MG ER capsule Take 300 mg by mouth 2 (two) times daily. To prevent seizures    . pravastatin (PRAVACHOL) 10 MG tablet Take 10 mg by mouth daily.    . sitaGLIPtin (JANUVIA) 50 MG tablet Take 50 mg by mouth daily.    . traZODone (DESYREL) 100 MG tablet Take 100 mg  by mouth at bedtime.    . Vitamin D, Ergocalciferol, (DRISDOL) 50000 units CAPS capsule Take 50,000 Units by mouth every 7 (seven) days.    . [DISCONTINUED] quinapril (ACCUPRIL) 5 MG tablet Take 5 mg by mouth daily. For blood pressure     No current facility-administered medications on file prior to visit.     Past Surgical History:  Procedure Laterality Date  . CHOLECYSTECTOMY      No Known Allergies  Social History   Social History  . Marital status: Single    Spouse name: N/A  . Number of children: N/A  . Years of education: N/A   Occupational History  . Not on file.   Social History Main Topics  . Smoking status: Never Smoker  . Smokeless tobacco: Never Used  . Alcohol use No  . Drug use: No  . Sexual activity: Not on file   Other Topics Concern  . Not on file   Social History Narrative  . No narrative on file    Family History  Problem Relation Age of Onset  . Hypertension Father   . Seizures Father     BP (!) 191/83   Pulse (!) 53   Ht 5\' 3"  (1.6 m)   Wt 152 lb (68.9 kg)   BMI 26.93 kg/m  Review of Systems: See HPI above.    Objective:  Physical Exam:  Gen: NAD, comfortable in exam room  Left knee: Mild effusion.  No other gross deformity, ecchymoses. Medial joint line tenderness.  No other tenderness. FROM. Negative ant/post drawers. Negative valgus/varus testing. Negative lachmanns. Negative mcmurrays, apleys, patellar apprehension. NV intact distally.    Assessment & Plan:  1. Left knee pain - pain 2/2 mild DJD flare vs medial meniscus tear.  Discussed tylenol, nsaids, glucosamine, topical medications.  Shown home exercises to do daily.  He wanted to repeat injection so this was done today. Arch supports, heat/ice.  He will work on getting cone coverage and will consider physical therapy, MRI after that if still not improving.  After informed written consent, patient was lying supine on exam table. Left knee was prepped with alcohol  swab and utilizing superolateral approach with ultrasound guidance, patient's left knee was injected intraarticularly with 3:1 marcaine: depomedrol. Patient tolerated the procedure well without immediate complications.

## 2016-07-30 ENCOUNTER — Ambulatory Visit: Payer: Self-pay | Admitting: Family Medicine

## 2016-10-19 ENCOUNTER — Ambulatory Visit (INDEPENDENT_AMBULATORY_CARE_PROVIDER_SITE_OTHER): Payer: Self-pay | Admitting: Family Medicine

## 2016-10-19 ENCOUNTER — Encounter: Payer: Self-pay | Admitting: Family Medicine

## 2016-10-19 DIAGNOSIS — M25561 Pain in right knee: Secondary | ICD-10-CM

## 2016-10-19 MED ORDER — DICLOFENAC SODIUM 75 MG PO TBEC: 75.0000 mg | DELAYED_RELEASE_TABLET | Freq: Two times a day (BID) | ORAL | 1 refills | Status: AC

## 2016-10-19 NOTE — Patient Instructions (Signed)
Your pain is due to arthritis vs a medial meniscus tear. Both are treated similarly initially. Take tylenol 500mg  1-2 tabs three times a day as needed for pain. Voltaren twice a day with food for pain and inflammation - take for 7-10 days then as needed Capsaicin, aspercreme, or biofreeze topically up to four times a day may also help with pain. Cortisone injections are an option if not improving. Get the cone coverage paperwork filled out and sent in - if not improving we can consider an MRI or physical therapy if you have this. It's important that you continue to stay active. Straight leg raises, knee extensions 3 sets of 10 once a day (add ankle weight if these become too easy). Consider physical therapy to strengthen muscles around the joint that hurts to take pressure off of the joint itself. Shoe inserts with good arch support may be helpful. Walker or cane if needed. Heat or ice 15 minutes at a time 3-4 times a day as needed to help with pain. Follow up with me in 1 month.

## 2016-10-20 DIAGNOSIS — M25561 Pain in right knee: Secondary | ICD-10-CM | POA: Insufficient documentation

## 2016-10-20 NOTE — Assessment & Plan Note (Signed)
consistent with flare of DJD vs medial meniscus tear.  Advised both treated similarly.  He would like to try voltaren with tylenol, topical medications first.  Consider injection, MRI, physical therapy.  Cone coverage paperwork provided.  Shown home exercises to do daily.  Heat/ice.  F/u in 1 month.

## 2016-10-20 NOTE — Progress Notes (Signed)
PCP: No PCP Per Patient  Subjective:   HPI: Patient is a 56 y.o. male here for right knee pain.  Patient seen previously for left knee pain. Current problem with right knee started a few weeks ago. Worsened about 2 weeks ago when getting out of car and felt a pop in right knee medially. Pain has worsened since then to 8/10, sharp anteromedially. Tried advil, ibuprofen, aleve, tylenol, ben gay without much benefit. Slight swelling. No skin changes, numbness.  Past Medical History:  Diagnosis Date  . Alcohol abuse   . High cholesterol   . Hypertension   . Seizure [facility](HCC)     Current Outpatient Prescriptions on File Prior to Visit  Medication Sig Dispense Refill  . cetirizine (ZYRTEC ALLERGY) 10 MG tablet Take 1 tablet (10 mg total) by mouth daily. 30 tablet 1  . fluticasone (FLONASE) 50 MCG/ACT nasal spray Place 2 sprays into both nostrils daily. 16 g 0  . furosemide (LASIX) 20 MG tablet Take 20 mg by mouth daily.    Marland Kitchen. glipiZIDE (GLUCOTROL XL) 5 MG 24 hr tablet Take 5 mg by mouth daily.  1  . HYDROcodone-acetaminophen (NORCO) 5-325 MG tablet Take 1 tablet by mouth every 6 (six) hours as needed for moderate pain. 40 tablet 0  . lisinopril (PRINIVIL,ZESTRIL) 40 MG tablet Take 40 mg by mouth daily.    Marland Kitchen. omeprazole (PRILOSEC) 20 MG capsule Take 20 mg by mouth daily.    Marland Kitchen. oxybutynin (DITROPAN) 5 MG tablet Take 5 mg by mouth 2 (two) times daily.    . phenytoin (DILANTIN) 100 MG ER capsule Take 300 mg by mouth 2 (two) times daily. To prevent seizures    . pravastatin (PRAVACHOL) 10 MG tablet Take 10 mg by mouth daily.    . sitaGLIPtin (JANUVIA) 50 MG tablet Take 50 mg by mouth daily.    . traZODone (DESYREL) 100 MG tablet Take 100 mg by mouth at bedtime.    . Vitamin D, Ergocalciferol, (DRISDOL) 50000 units CAPS capsule Take 50,000 Units by mouth every 7 (seven) days.    . [DISCONTINUED] quinapril (ACCUPRIL) 5 MG tablet Take 5 mg by mouth daily. For blood pressure     No current  facility-administered medications on file prior to visit.     Past Surgical History:  Procedure Laterality Date  . CHOLECYSTECTOMY      No Known Allergies  Social History   Social History  . Marital status: Single    Spouse name: N/A  . Number of children: N/A  . Years of education: N/A   Occupational History  . Not on file.   Social History Main Topics  . Smoking status: Never Smoker  . Smokeless tobacco: Never Used  . Alcohol use No  . Drug use: No  . Sexual activity: Not on file   Other Topics Concern  . Not on file   Social History Narrative  . No narrative on file    Family History  Problem Relation Age of Onset  . Hypertension Father   . Seizures Father     BP (!) 190/93   Pulse (!) 49   Ht 5\' 3"  (1.6 m)   Wt 130 lb (59 kg)   BMI 23.03 kg/m   Review of Systems: See HPI above.     Objective:  Physical Exam:  Gen: NAD, comfortable in exam room  Right knee: No gross deformity, ecchymoses, effusion. TTP medial joint line.  No other tenderness. FROM. Negative ant/post drawers. Negative valgus/varus  testing. Negative lachmanns. Positive mcmurrays, apleys, negative patellar apprehension. NV intact distally.  Left knee: FROM without pain.   Assessment & Plan:  1. Right knee pain - consistent with flare of DJD vs medial meniscus tear.  Advised both treated similarly.  He would like to try voltaren with tylenol, topical medications first.  Consider injection, MRI, physical therapy.  Cone coverage paperwork provided.  Shown home exercises to do daily.  Heat/ice.  F/u in 1 month.

## 2016-11-16 ENCOUNTER — Ambulatory Visit: Payer: Self-pay | Admitting: Family Medicine

## 2017-01-11 ENCOUNTER — Emergency Department (HOSPITAL_BASED_OUTPATIENT_CLINIC_OR_DEPARTMENT_OTHER): Payer: BLUE CROSS/BLUE SHIELD

## 2017-01-11 ENCOUNTER — Encounter (HOSPITAL_BASED_OUTPATIENT_CLINIC_OR_DEPARTMENT_OTHER): Payer: Self-pay | Admitting: Emergency Medicine

## 2017-01-11 ENCOUNTER — Emergency Department (HOSPITAL_BASED_OUTPATIENT_CLINIC_OR_DEPARTMENT_OTHER)
Admission: EM | Admit: 2017-01-11 | Discharge: 2017-01-11 | Disposition: A | Discharge status: Home / Self Care | Payer: BLUE CROSS/BLUE SHIELD | Attending: Emergency Medicine | Admitting: Emergency Medicine

## 2017-01-11 DIAGNOSIS — M545 Low back pain, unspecified: Secondary | ICD-10-CM

## 2017-01-11 DIAGNOSIS — E119 Type 2 diabetes mellitus without complications: Secondary | ICD-10-CM | POA: Insufficient documentation

## 2017-01-11 DIAGNOSIS — Z791 Long term (current) use of non-steroidal anti-inflammatories (NSAID): Secondary | ICD-10-CM | POA: Diagnosis not present

## 2017-01-11 DIAGNOSIS — Z79899 Other long term (current) drug therapy: Secondary | ICD-10-CM | POA: Diagnosis not present

## 2017-01-11 DIAGNOSIS — Z7984 Long term (current) use of oral hypoglycemic drugs: Secondary | ICD-10-CM | POA: Diagnosis not present

## 2017-01-11 DIAGNOSIS — Z7902 Long term (current) use of antithrombotics/antiplatelets: Secondary | ICD-10-CM | POA: Diagnosis not present

## 2017-01-11 DIAGNOSIS — I10 Essential (primary) hypertension: Secondary | ICD-10-CM | POA: Diagnosis not present

## 2017-01-11 LAB — URINALYSIS, ROUTINE W REFLEX MICROSCOPIC
Bilirubin Urine: NEGATIVE
Glucose, UA: NEGATIVE mg/dL
Hgb urine dipstick: NEGATIVE
Ketones, ur: NEGATIVE mg/dL
LEUKOCYTES UA: NEGATIVE
NITRITE: NEGATIVE
PH: 6.5 (ref 5.0–8.0)
Protein, ur: NEGATIVE mg/dL
Specific Gravity, Urine: 1.003 — ABNORMAL LOW (ref 1.005–1.030)

## 2017-01-11 MED ORDER — CYCLOBENZAPRINE HCL 10 MG PO TABS: 10.0000 mg | ORAL_TABLET | Freq: Two times a day (BID) | ORAL | 0 refills | Status: AC | PRN

## 2017-01-11 MED ORDER — IBUPROFEN 400 MG PO TABS: 400.0000 mg | ORAL_TABLET | Freq: Four times a day (QID) | ORAL | 0 refills | Status: AC | PRN

## 2017-01-11 NOTE — ED Provider Notes (Signed)
[facility] Note   CSN: [id number] Arrival date & time: 01/11/17  [id number]     History   Chief Complaint Chief Complaint  Patient presents with  . Back Pain    HPI Jerry Small is a 56 y.o. male.  HPI Patient presents to the emergency room for evaluation of low back pain. Patient states symptoms started on Friday. He said pain in his lower back primarily in the midline and on the left side. The pain increases with certain movements and positions. It also increases with palpation.  He is noticed some increased urinary frequency but thinks that may be related to his medications. He denies any abdominal pain. No dysuria. No numbness or weakness. No recent falls or injuries. No vomiting or diarrhea. Past Medical History:  Diagnosis Date  . Alcohol abuse   . High cholesterol   . Hypertension   . Seizure [facility](HCC)     Patient Active Problem List   Diagnosis Date Noted  . Right knee pain 10/20/2016  . Left knee pain 11/15/2015  . Type 2 diabetes mellitus (HCC) 03/24/2015  . BP (high blood pressure) 03/24/2015  . Seizure (HCC) 03/24/2015  . Alcohol dependence (HCC) 03/25/2012    Past Surgical History:  Procedure Laterality Date  . CHOLECYSTECTOMY         Home Medications    Prior to Admission medications   Medication Sig Start Date End Date Taking? Authorizing Provider  cetirizine (ZYRTEC ALLERGY) 10 MG tablet Take 1 tablet (10 mg total) by mouth daily. 11/06/15   Everlene Farrieransie, William, PA-C  cyclobenzaprine (FLEXERIL) 10 MG tablet Take 1 tablet (10 mg total) by mouth 2 (two) times daily as needed for muscle spasms. 01/11/17   Linwood DibblesKnapp, Marajade Lei, MD  diclofenac (VOLTAREN) 75 MG EC tablet Take 1 tablet (75 mg total) by mouth 2 (two) times daily. 10/19/16   Hudnall, Azucena FallenShane R, MD  fluticasone (FLONASE) 50 MCG/ACT nasal spray Place 2 sprays into both nostrils daily. 11/06/15   Everlene Farrieransie, William, PA-C  furosemide (LASIX) 20 MG tablet Take 20 mg by mouth daily.    [provider]  glipiZIDE (GLUCOTROL XL) 5 MG 24 hr tablet Take 5 mg by mouth daily. 10/30/15   [provider]  HYDROcodone-acetaminophen (NORCO) 5-325 MG tablet Take 1 tablet by mouth every 6 (six) hours as needed for moderate pain. 11/13/15   Hudnall, Azucena FallenShane R, MD  ibuprofen (ADVIL,MOTRIN) 400 MG tablet Take 1 tablet (400 mg total) by mouth every 6 (six) hours as needed. 01/11/17   Linwood DibblesKnapp, Marcus Groll, MD  lisinopril (PRINIVIL,ZESTRIL) 40 MG tablet Take 40 mg by mouth daily.    [provider]  omeprazole (PRILOSEC) 20 MG capsule Take 20 mg by mouth daily.    [provider]  oxybutynin (DITROPAN) 5 MG tablet Take 5 mg by mouth 2 (two) times daily.    [provider]  phenytoin (DILANTIN) 100 MG ER capsule Take 300 mg by mouth 2 (two) times daily. To prevent seizures 03/28/12   Verne SpurrMashburn, Neil T, PA-C  pravastatin (PRAVACHOL) 10 MG tablet Take 10 mg by mouth daily.    [provider]  sitaGLIPtin (JANUVIA) 50 MG tablet Take 50 mg by mouth daily.    [provider]  traZODone (DESYREL) 100 MG tablet Take 100 mg by mouth at bedtime.    [provider]  Vitamin D, Ergocalciferol, (DRISDOL) 50000 units CAPS capsule Take 50,000 Units by mouth every 7 (seven) days.    [provider]  Family History Family History  Problem Relation Age of Onset  . Hypertension Father   . Seizures Father     Social History Social History  Substance Use Topics  . Smoking status: Never Smoker  . Smokeless tobacco: Never Used  . Alcohol use No     Allergies   Patient has no known allergies.   Review of Systems Review of Systems  Constitutional: Negative for fever.  HENT: Positive for dental problem (he has been having trouble with toothaches, he is seeing a dentist).   All other systems reviewed and are negative.    Physical Exam Updated Vital Signs BP (!) 187/85 (BP Location: Left Arm)   Pulse (!) 50   Temp 97.9 F (36.6 C) (Oral)    Resp 14   Ht 1.6 m (5\' 3" )   Wt 61.7 kg (136 lb)   SpO2 100%   BMI 24.09 kg/m   Physical Exam  Constitutional: He appears well-developed and well-nourished. No distress.  HENT:  Head: Normocephalic and atraumatic.  Right Ear: External ear normal.  Left Ear: External ear normal.  Eyes: Conjunctivae are normal. Right eye exhibits no discharge. Left eye exhibits no discharge. No scleral icterus.  Neck: Neck supple. No tracheal deviation present.  Cardiovascular: Normal rate, regular rhythm and intact distal pulses.   Pulmonary/Chest: Effort normal and breath sounds normal. No stridor. No respiratory distress. He has no wheezes. He has no rales.  Abdominal: Soft. Bowel sounds are normal. He exhibits no distension. There is no tenderness. There is no rebound and no guarding.  Musculoskeletal: He exhibits no edema.       Lumbar back: He exhibits decreased range of motion and tenderness. He exhibits no bony tenderness, no swelling, no edema and no deformity.  Neurological: He is alert. He has normal strength. No cranial nerve deficit (no facial droop, extraocular movements intact, no slurred speech) or sensory deficit. He exhibits normal muscle tone. He displays no seizure activity. Coordination normal.  Skin: Skin is warm and dry. No rash noted.  Psychiatric: He has a normal mood and affect.  Nursing note and vitals reviewed.    ED Treatments / Results  Labs (all labs ordered are listed, but only abnormal results are displayed) Labs Reviewed  URINALYSIS, ROUTINE W REFLEX MICROSCOPIC - Abnormal; Notable for the following:       Result Value   Specific Gravity, Urine 1.003 (*)    All other components within normal limits    Radiology Dg Lumbar Spine Complete  Result Date: 01/11/2017 CLINICAL DATA:  Low back pain for 3 days. No known injury or radicular symptoms. EXAM: LUMBAR SPINE - COMPLETE 4+ VIEW COMPARISON:  Chest radiographs 10/2015. Abdominopelvic CT 11/24/2012. Lumbar spine  radiographs 07/28/2012. FINDINGS: Chest radiographs demonstrate 12 rib-bearing thoracic type vertebral bodies. There is transitional lumbosacral anatomy with a nearly fully lumbarized S1 segment. This numbering differs from the previous lumbar spine radiographic series. The alignment is stable with a grade 1 anterolisthesis at L4-5. Mild chronic compression deformity at T12 appears unchanged. No evidence of acute fracture or pars defect. There is stable mild disc space narrowing at S1-2 and mild facet hypertrophy. Aortoiliac atherosclerosis noted. IMPRESSION: No acute findings. Stable spondylosis. 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 Electronically Signed   By: Carey Bullocks M.D.   On: 01/11/2017 10:29    Procedures Procedures (including critical care time)  Medications Ordered in ED Medications - No data to display   Initial Impression / Assessment and  Plan / ED Course  I have reviewed the triage vital signs and the nursing notes.  Pertinent labs & imaging results that were available during my care of the patient were reviewed by me and considered in my medical decision making (see chart for details).    No sign of acute neurological or vascular emergency associated with pt's back pain.  Doubt sciatica.  Safe for outpatient follow up.   Final Clinical Impressions(s) / ED Diagnoses   Final diagnoses:  Acute low back pain without sciatica, unspecified back pain laterality    New Prescriptions New Prescriptions   CYCLOBENZAPRINE (FLEXERIL) 10 MG TABLET    Take 1 tablet (10 mg total) by mouth 2 (two) times daily as needed for muscle spasms.   IBUPROFEN (ADVIL,MOTRIN) 400 MG TABLET    Take 1 tablet (400 mg total) by mouth every 6 (six) hours as needed.     Linwood Dibbles, MD 01/11/17 [phone]

## 2017-01-11 NOTE — Discharge Instructions (Signed)
Follow-up with a primary care doctor or  consider seeing a chiropractor for low back pain. Monitor for fever, numbness or weakness.

## 2017-01-11 NOTE — ED Triage Notes (Signed)
Patient states that he has had pain to his bilateral lower pelvic region around to his bilateral flank area. The patient denies and N/V - the patient reports that he is also having dental pain.

## 2017-11-02 ENCOUNTER — Emergency Department (HOSPITAL_BASED_OUTPATIENT_CLINIC_OR_DEPARTMENT_OTHER)
Admission: EM | Admit: 2017-11-02 | Discharge: 2017-11-02 | Disposition: A | Discharge status: Home / Self Care | Payer: BLUE CROSS/BLUE SHIELD | Attending: Emergency Medicine | Admitting: Emergency Medicine

## 2017-11-02 ENCOUNTER — Other Ambulatory Visit: Payer: Self-pay

## 2017-11-02 ENCOUNTER — Encounter (HOSPITAL_BASED_OUTPATIENT_CLINIC_OR_DEPARTMENT_OTHER): Payer: Self-pay | Admitting: *Deleted

## 2017-11-02 DIAGNOSIS — R112 Nausea with vomiting, unspecified: Secondary | ICD-10-CM | POA: Diagnosis present

## 2017-11-02 DIAGNOSIS — F1010 Alcohol abuse, uncomplicated: Secondary | ICD-10-CM | POA: Diagnosis not present

## 2017-11-02 DIAGNOSIS — I10 Essential (primary) hypertension: Secondary | ICD-10-CM | POA: Insufficient documentation

## 2017-11-02 DIAGNOSIS — E119 Type 2 diabetes mellitus without complications: Secondary | ICD-10-CM | POA: Insufficient documentation

## 2017-11-02 DIAGNOSIS — R197 Diarrhea, unspecified: Secondary | ICD-10-CM | POA: Insufficient documentation

## 2017-11-02 DIAGNOSIS — Z79899 Other long term (current) drug therapy: Secondary | ICD-10-CM | POA: Insufficient documentation

## 2017-11-02 DIAGNOSIS — Z7984 Long term (current) use of oral hypoglycemic drugs: Secondary | ICD-10-CM | POA: Diagnosis not present

## 2017-11-02 HISTORY — DX: Type 2 diabetes mellitus without complications: E11.9

## 2017-11-02 LAB — URINALYSIS, ROUTINE W REFLEX MICROSCOPIC
Bilirubin Urine: NEGATIVE
GLUCOSE, UA: NEGATIVE mg/dL
Ketones, ur: NEGATIVE mg/dL
LEUKOCYTES UA: NEGATIVE
Nitrite: NEGATIVE
PH: 6 (ref 5.0–8.0)
Protein, ur: 30 mg/dL — AB

## 2017-11-02 LAB — CBC
HEMATOCRIT: 38.4 % — AB (ref 39.0–52.0)
Hemoglobin: 13.9 g/dL (ref 13.0–17.0)
MCH: 34.7 pg — ABNORMAL HIGH (ref 26.0–34.0)
MCHC: 36.2 g/dL — ABNORMAL HIGH (ref 30.0–36.0)
MCV: 95.8 fL (ref 78.0–100.0)
Platelets: 142 10*3/uL — ABNORMAL LOW (ref 150–400)
RBC: 4.01 MIL/uL — ABNORMAL LOW (ref 4.22–5.81)
RDW: 13.4 % (ref 11.5–15.5)
WBC: 6.4 10*3/uL (ref 4.0–10.5)

## 2017-11-02 LAB — COMPREHENSIVE METABOLIC PANEL
ALT: 121 U/L — ABNORMAL HIGH (ref 17–63)
AST: 408 U/L — AB (ref 15–41)
Albumin: 4.3 g/dL (ref 3.5–5.0)
Alkaline Phosphatase: 249 U/L — ABNORMAL HIGH (ref 38–126)
Anion gap: 9 (ref 5–15)
BUN: 19 mg/dL (ref 6–20)
CHLORIDE: 99 mmol/L — AB (ref 101–111)
CO2: 28 mmol/L (ref 22–32)
Calcium: 8.6 mg/dL — ABNORMAL LOW (ref 8.9–10.3)
Creatinine, Ser: 0.98 mg/dL (ref 0.61–1.24)
GFR calc Af Amer: >60 mL/min (ref >60)
Glucose, Bld: 147 mg/dL — ABNORMAL HIGH (ref 65–99)
POTASSIUM: 5 mmol/L (ref 3.5–5.1)
Sodium: 136 mmol/L (ref 135–145)
Total Bilirubin: 0.6 mg/dL (ref 0.3–1.2)
Total Protein: 8.5 g/dL — ABNORMAL HIGH (ref 6.5–8.1)

## 2017-11-02 LAB — URINALYSIS, MICROSCOPIC (REFLEX)

## 2017-11-02 LAB — LIPASE, BLOOD: LIPASE: 17 U/L (ref 11–51)

## 2017-11-02 LAB — ETHANOL: Alcohol, Ethyl (B): 314 mg/dL (ref <10)

## 2017-11-02 MED ORDER — LORAZEPAM 1 MG PO TABS: 0.0000 mg | ORAL_TABLET | Freq: Four times a day (QID) | ORAL | Status: DC

## 2017-11-02 MED ORDER — THIAMINE HCL 100 MG/ML IJ SOLN
100.0000 mg | Freq: Every day | INTRAMUSCULAR | Status: DC
  Administered 2017-11-02: 100 mg via INTRAVENOUS
  Filled 2017-11-02: qty 2

## 2017-11-02 MED ORDER — SODIUM CHLORIDE 0.9 % IV BOLUS (SEPSIS)
1000.0000 mL | Freq: Once | INTRAVENOUS | Status: AC
  Administered 2017-11-02: 1000 mL via INTRAVENOUS

## 2017-11-02 MED ORDER — LORAZEPAM 2 MG/ML IJ SOLN: 0.0000 mg | Freq: Two times a day (BID) | INTRAMUSCULAR | Status: DC

## 2017-11-02 MED ORDER — LORAZEPAM 1 MG PO TABS: 0.0000 mg | ORAL_TABLET | Freq: Two times a day (BID) | ORAL | Status: DC

## 2017-11-02 MED ORDER — ONDANSETRON HCL 4 MG/2ML IJ SOLN
4.0000 mg | Freq: Once | INTRAMUSCULAR | Status: AC
  Administered 2017-11-02: 4 mg via INTRAVENOUS
  Filled 2017-11-02: qty 2

## 2017-11-02 MED ORDER — OMEPRAZOLE 20 MG PO CPDR: 20.0000 mg | DELAYED_RELEASE_CAPSULE | Freq: Every day | ORAL | 0 refills | Status: AC

## 2017-11-02 MED ORDER — VITAMIN B-1 100 MG PO TABS: 100.0000 mg | ORAL_TABLET | Freq: Every day | ORAL | Status: DC

## 2017-11-02 MED ORDER — LORAZEPAM 2 MG/ML IJ SOLN: 0.0000 mg | Freq: Four times a day (QID) | INTRAMUSCULAR | Status: DC

## 2017-11-02 MED ORDER — PRAVASTATIN SODIUM 10 MG PO TABS: 10.0000 mg | ORAL_TABLET | Freq: Every day | ORAL | 0 refills | Status: AC

## 2017-11-02 MED ORDER — LISINOPRIL 40 MG PO TABS: 40.0000 mg | ORAL_TABLET | Freq: Every day | ORAL | 0 refills | Status: AC

## 2017-11-02 MED ORDER — PROMETHAZINE HCL 25 MG PO TABS: 25.0000 mg | ORAL_TABLET | Freq: Three times a day (TID) | ORAL | 0 refills | Status: AC | PRN

## 2017-11-02 MED ORDER — PANTOPRAZOLE SODIUM 40 MG IV SOLR
40.0000 mg | Freq: Once | INTRAVENOUS | Status: AC
  Administered 2017-11-02: 40 mg via INTRAVENOUS
  Filled 2017-11-02: qty 40

## 2017-11-02 NOTE — ED Notes (Signed)
Date and time results received: 11/02/17 1237 (use smartphrase ".now" to insert current time)  Test: ethanol Critical Value: 314  Name of Provider Notified: Little  Orders Received? Or Actions Taken?: no orders given

## 2017-11-02 NOTE — ED Provider Notes (Signed)
[place] EMERGENCY DEPARTMENT Provider Note   CSN: [id number] Arrival date & time: 11/02/17  1124     History   Chief Complaint Chief Complaint  Patient presents with  . Abdominal Pain  . Diarrhea    HPI Jerry Small is a 57 y.o. male.  57 year old male with past medical history including alcohol abuse, hypertension, hyperlipidemia, type 2 diabetes mellitus who presents with vomiting, diarrhea, and abdominal pain.  The patient has had 3-4 days of nonbloody diarrhea, 4 episodes today, associated with intermittent vomiting and generalized abdominal pain.  The abdominal pain is worse just before vomiting or having an episode of diarrhea.  Last episode of vomiting was this morning.  He reports daily alcohol use, 2-3 beers per day, last drink was yesterday.  He has not been able to eat or drink much for the past 2 days.  He denies any urinary symptoms, cough/cold symptoms, fevers.  He has not been on any of his medications for the past 6 months because he does not have a PCP.   The history is provided by the patient.  Abdominal Pain   Associated symptoms include diarrhea.  Diarrhea   Associated symptoms include abdominal pain.    Past Medical History:  Diagnosis Date  . Alcohol abuse   . Diabetes mellitus without complication ([place])   . High cholesterol   . Hypertension   . Seizure [facility])     Patient Active Problem List   Diagnosis Date Noted  . Right knee pain 10/20/2016  . Left knee pain 11/15/2015  . Type 2 diabetes mellitus ([place]) 03/24/2015  . BP (high blood pressure) 03/24/2015  . Seizure ([place]) 03/24/2015  . Alcohol dependence ([place]) 03/25/2012    Past Surgical History:  Procedure Laterality Date  . CHOLECYSTECTOMY         Home Medications    Prior to Admission medications   Medication Sig Start Date End Date Taking? Authorizing Provider  cetirizine (ZYRTEC ALLERGY) 10 MG tablet Take 1 tablet (10 mg total) by mouth daily. 11/06/15   Waynetta Pean, PA-C  cyclobenzaprine (FLEXERIL) 10 MG tablet Take 1 tablet (10 mg total) by mouth 2 (two) times daily as needed for muscle spasms. 01/11/17   Dorie Rank, MD  diclofenac (VOLTAREN) 75 MG EC tablet Take 1 tablet (75 mg total) by mouth 2 (two) times daily. 10/19/16   Hudnall, Sharyn Lull, MD  fluticasone (FLONASE) 50 MCG/ACT nasal spray Place 2 sprays into both nostrils daily. 11/06/15   Waynetta Pean, PA-C  furosemide (LASIX) 20 MG tablet Take 20 mg by mouth daily.    [provider]  glipiZIDE (GLUCOTROL XL) 5 MG 24 hr tablet Take 5 mg by mouth daily. 10/30/15   [provider]  HYDROcodone-acetaminophen (NORCO) 5-325 MG tablet Take 1 tablet by mouth every 6 (six) hours as needed for moderate pain. 11/13/15   Hudnall, Sharyn Lull, MD  ibuprofen (ADVIL,MOTRIN) 400 MG tablet Take 1 tablet (400 mg total) by mouth every 6 (six) hours as needed. 01/11/17   Dorie Rank, MD  lisinopril (PRINIVIL,ZESTRIL) 40 MG tablet Take 1 tablet (40 mg total) by mouth daily. 11/02/17   Jaxxson Cavanah, Wenda Overland, MD  omeprazole (PRILOSEC) 20 MG capsule Take 1 capsule (20 mg total) by mouth daily. 11/02/17   Dyllan Kats, Wenda Overland, MD  oxybutynin (DITROPAN) 5 MG tablet Take 5 mg by mouth 2 (two) times daily.    [provider]  phenytoin (DILANTIN) 100 MG ER capsule Take 300 mg by  mouth 2 (two) times daily. To prevent seizures 03/28/12   Nena Polio T, PA-C  pravastatin (PRAVACHOL) 10 MG tablet Take 1 tablet (10 mg total) by mouth daily. 11/02/17   Rockford Leinen, Wenda Overland, MD  promethazine (PHENERGAN) 25 MG tablet Take 1 tablet (25 mg total) by mouth every 8 (eight) hours as needed for nausea or vomiting. 11/02/17   Deanette Tullius, Wenda Overland, MD  sitaGLIPtin (JANUVIA) 50 MG tablet Take 50 mg by mouth daily.    [provider]  traZODone (DESYREL) 100 MG tablet Take 100 mg by mouth at bedtime.    [provider]  Vitamin D, Ergocalciferol, (DRISDOL) 50000 units CAPS capsule Take 50,000 Units by  mouth every 7 (seven) days.    [provider]  quinapril (ACCUPRIL) 5 MG tablet Take 5 mg by mouth daily. For blood pressure 03/28/12 04/18/12  Ruben Im, PA-C    Family History Family History  Problem Relation Age of Onset  . Hypertension Father   . Seizures Father     Social History Social History   Tobacco Use  . Smoking status: Never Smoker  . Smokeless tobacco: Never Used  Substance Use Topics  . Alcohol use: Yes    Alcohol/week: 0.0 oz  . Drug use: No     Allergies   Patient has no known allergies.   Review of Systems Review of Systems  Gastrointestinal: Positive for abdominal pain and diarrhea.   All other systems reviewed and are negative except that which was mentioned in HPI   Physical Exam Updated Vital Signs BP (!) 213/101 (BP Location: Right Arm)   Pulse 78   Temp 98.3 F (36.8 C) (Oral)   Resp 16   Ht '5\' 3"'$  (1.6 m)   Wt 60.8 kg (134 lb)   SpO2 100%   BMI 23.74 kg/m   Physical Exam  Constitutional: He is oriented to person, place, and time. He appears well-developed and well-nourished. No distress.  HENT:  Head: Normocephalic and atraumatic.  Moist mucous membranes  Eyes: Conjunctivae are normal. Pupils are equal, round, and reactive to light.  Neck: Neck supple.  Cardiovascular: Normal rate, regular rhythm and normal heart sounds.  No murmur heard. Pulmonary/Chest: Effort normal and breath sounds normal.  Abdominal: Soft. Bowel sounds are normal. He exhibits no distension. There is no rebound and no guarding.  Mild RLQ and epigastric tenderness  Musculoskeletal: He exhibits no edema.  Neurological: He is alert and oriented to person, place, and time.  Fluent speech  Skin: Skin is warm and dry.  Psychiatric: He has a normal mood and affect. Judgment normal.  Nursing note and vitals reviewed.    ED Treatments / Results  Labs (all labs ordered are listed, but only abnormal results are displayed) Labs Reviewed    COMPREHENSIVE METABOLIC PANEL - Abnormal; Notable for the following components:      Result Value   Chloride 99 (*)    Glucose, Bld 147 (*)    Calcium 8.6 (*)    Total Protein 8.5 (*)    AST 408 (*)    ALT 121 (*)    Alkaline Phosphatase 249 (*)    All other components within normal limits  CBC - Abnormal; Notable for the following components:   RBC 4.01 (*)    HCT 38.4 (*)    MCH 34.7 (*)    MCHC 36.2 (*)    Platelets 142 (*)    All other components within normal limits  URINALYSIS, ROUTINE W  REFLEX MICROSCOPIC - Abnormal; Notable for the following components:   Specific Gravity, Urine <1.005 (*)    Hgb urine dipstick SMALL (*)    Protein, ur 30 (*)    All other components within normal limits  ETHANOL - Abnormal; Notable for the following components:   Alcohol, Ethyl (B) 314 (*)    All other components within normal limits  URINALYSIS, MICROSCOPIC (REFLEX) - Abnormal; Notable for the following components:   Bacteria, UA RARE (*)    Squamous Epithelial / LPF 0-5 (*)    All other components within normal limits  LIPASE, BLOOD    EKG  EKG Interpretation None       Radiology No results found.  Procedures Procedures (including critical care time)  Medications Ordered in ED Medications  LORazepam (ATIVAN) injection 0-4 mg (0 mg Intravenous Not Given 11/02/17 1234)    Or  LORazepam (ATIVAN) tablet 0-4 mg ( Oral See Alternative 11/02/17 1234)  LORazepam (ATIVAN) injection 0-4 mg ( Intravenous See Alternative 11/02/17 1225)    Or  LORazepam (ATIVAN) tablet 0-4 mg (0 mg Oral Not Given 11/02/17 1225)  thiamine (VITAMIN B-1) tablet 100 mg ( Oral See Alternative 11/02/17 1232)    Or  thiamine (B-1) injection 100 mg (100 mg Intravenous Given 11/02/17 1232)  sodium chloride 0.9 % bolus 1,000 mL (0 mLs Intravenous Stopped 11/02/17 1316)  ondansetron (ZOFRAN) injection 4 mg (4 mg Intravenous Given 11/02/17 1232)  pantoprazole (PROTONIX) injection 40 mg (40 mg Intravenous Given  11/02/17 1232)     Initial Impression / Assessment and Plan / ED Course  I have reviewed the triage vital signs and the nursing notes.  Pertinent labs & imaging results that were available during my care of the patient were reviewed by me and considered in my medical decision making (see chart for details).    Pt well appearing on exam, hypertensive but normal HR. mild tenderness in right lower quadrant and mid epigastrium with no distention or guarding.  Gave IV fluids, Protonix, Zofran.  Labs show WBC 6.4, normal hemoglobin, normal lipase, normal creatinine and no evidence of severe dehydration.  AST 408, ALT 121, alk phos 249, alcohol level 314.  I suspect his elevated LFTs are due to his chronic alcohol use.  I have spent greater than 5 minutes counseling the patient on the importance of alcohol cessation.  After receiving above medications, he was feeling better and drinking liquids on exam.  Provided with refill of blood pressure medications as well as PPI but recommended that he follow-up with PCP regarding his other medications.  It is unclear whether he has a seizure history related to alcohol or in isolation therefore held on restarting his Dilantin.  Discussed supportive measures at home for vomiting and diarrhea and extensively reviewed return precautions.  He voiced understanding and was discharged home in satisfactory condition.  Final Clinical Impressions(s) / ED Diagnoses   Final diagnoses:  Nausea vomiting and diarrhea  Alcohol abuse    ED Discharge Orders        Ordered    omeprazole (PRILOSEC) 20 MG capsule  Daily     11/02/17 1351    lisinopril (PRINIVIL,ZESTRIL) 40 MG tablet  Daily     11/02/17 1351    pravastatin (PRAVACHOL) 10 MG tablet  Daily     11/02/17 1351    promethazine (PHENERGAN) 25 MG tablet  Every 8 hours PRN     11/02/17 1351       Dayla Gasca, Wenda Overland, MD  11/02/17 1358  

## 2017-11-02 NOTE — ED Triage Notes (Addendum)
Abdominal pain and diarrhea for 3-4 days. He had 4 BM's today. He admits to alcohol abuse. His last beer was yesterday. He has not taken any of his medications for diabetes or HTN in over 6 months. Has not been eating for 2 days. States he [facility]. He had blood drawn for lab work but left before tx was complete due to long wait time.

## 2018-02-23 ENCOUNTER — Other Ambulatory Visit: Payer: Self-pay

## 2018-02-23 ENCOUNTER — Encounter (HOSPITAL_BASED_OUTPATIENT_CLINIC_OR_DEPARTMENT_OTHER): Payer: Self-pay | Admitting: Emergency Medicine

## 2018-02-23 ENCOUNTER — Emergency Department (HOSPITAL_BASED_OUTPATIENT_CLINIC_OR_DEPARTMENT_OTHER)
Admission: EM | Admit: 2018-02-23 | Discharge: 2018-02-23 | Disposition: A | Discharge status: Home / Self Care | Payer: BLUE CROSS/BLUE SHIELD | Attending: Emergency Medicine | Admitting: Emergency Medicine

## 2018-02-23 ENCOUNTER — Emergency Department (HOSPITAL_BASED_OUTPATIENT_CLINIC_OR_DEPARTMENT_OTHER): Payer: BLUE CROSS/BLUE SHIELD

## 2018-02-23 DIAGNOSIS — Z7984 Long term (current) use of oral hypoglycemic drugs: Secondary | ICD-10-CM | POA: Diagnosis not present

## 2018-02-23 DIAGNOSIS — E119 Type 2 diabetes mellitus without complications: Secondary | ICD-10-CM | POA: Insufficient documentation

## 2018-02-23 DIAGNOSIS — R0981 Nasal congestion: Secondary | ICD-10-CM | POA: Diagnosis present

## 2018-02-23 DIAGNOSIS — R079 Chest pain, unspecified: Secondary | ICD-10-CM | POA: Diagnosis not present

## 2018-02-23 DIAGNOSIS — Z7902 Long term (current) use of antithrombotics/antiplatelets: Secondary | ICD-10-CM | POA: Insufficient documentation

## 2018-02-23 DIAGNOSIS — I10 Essential (primary) hypertension: Secondary | ICD-10-CM | POA: Insufficient documentation

## 2018-02-23 DIAGNOSIS — R0989 Other specified symptoms and signs involving the circulatory and respiratory systems: Secondary | ICD-10-CM | POA: Insufficient documentation

## 2018-02-23 DIAGNOSIS — R05 Cough: Secondary | ICD-10-CM | POA: Insufficient documentation

## 2018-02-23 DIAGNOSIS — J209 Acute bronchitis, unspecified: Secondary | ICD-10-CM

## 2018-02-23 DIAGNOSIS — Z79899 Other long term (current) drug therapy: Secondary | ICD-10-CM | POA: Insufficient documentation

## 2018-02-23 LAB — CBC WITH DIFFERENTIAL/PLATELET
BASOS PCT: 0 %
Basophils Absolute: 0 10*3/uL (ref 0.0–0.1)
EOS PCT: 1 %
Eosinophils Absolute: 0 10*3/uL (ref 0.0–0.7)
HEMATOCRIT: 35.7 % — AB (ref 39.0–52.0)
Hemoglobin: 13 g/dL (ref 13.0–17.0)
LYMPHS ABS: 1.7 10*3/uL (ref 0.7–4.0)
Lymphocytes Relative: 38 %
MCH: 35.4 pg — AB (ref 26.0–34.0)
MCHC: 36.4 g/dL — ABNORMAL HIGH (ref 30.0–36.0)
MCV: 97.3 fL (ref 78.0–100.0)
Monocytes Absolute: 0.5 10*3/uL (ref 0.1–1.0)
Monocytes Relative: 12 %
Neutro Abs: 2.3 10*3/uL (ref 1.7–7.7)
Neutrophils Relative %: 49 %
PLATELETS: 82 10*3/uL — AB (ref 150–400)
RBC: 3.67 MIL/uL — ABNORMAL LOW (ref 4.22–5.81)
RDW: 12 % (ref 11.5–15.5)
WBC: 4.5 10*3/uL (ref 4.0–10.5)

## 2018-02-23 LAB — D-DIMER, QUANTITATIVE: D-Dimer, Quant: 0.36 ug/mL-FEU (ref 0.00–0.50)

## 2018-02-23 LAB — TROPONIN I: Troponin I: <0.03 ng/mL (ref <0.03)

## 2018-02-23 LAB — BASIC METABOLIC PANEL
Anion gap: 12 (ref 5–15)
BUN: 15 mg/dL (ref 6–20)
CHLORIDE: 96 mmol/L — AB (ref 98–111)
CO2: 29 mmol/L (ref 22–32)
CREATININE: 0.96 mg/dL (ref 0.61–1.24)
Calcium: 8.4 mg/dL — ABNORMAL LOW (ref 8.9–10.3)
GFR calc Af Amer: >60 mL/min (ref >60)
GFR calc non Af Amer: >60 mL/min (ref >60)
Glucose, Bld: 189 mg/dL — ABNORMAL HIGH (ref 70–99)
Potassium: 4.2 mmol/L (ref 3.5–5.1)
Sodium: 137 mmol/L (ref 135–145)

## 2018-02-23 MED ORDER — ALBUTEROL SULFATE HFA 108 (90 BASE) MCG/ACT IN AERS
2.0000 | INHALATION_SPRAY | Freq: Once | RESPIRATORY_TRACT | Status: AC
  Administered 2018-02-23: 2 via RESPIRATORY_TRACT
  Filled 2018-02-23: qty 6.7

## 2018-02-23 MED ORDER — IPRATROPIUM-ALBUTEROL 0.5-2.5 (3) MG/3ML IN SOLN
3.0000 mL | Freq: Once | RESPIRATORY_TRACT | Status: AC
  Administered 2018-02-23: 3 mL via RESPIRATORY_TRACT
  Filled 2018-02-23: qty 3

## 2018-02-23 MED ORDER — DOXYCYCLINE HYCLATE 100 MG PO CAPS: 100.0000 mg | ORAL_CAPSULE | Freq: Two times a day (BID) | ORAL | 0 refills | Status: AC

## 2018-02-23 MED ORDER — DEXAMETHASONE 6 MG PO TABS
10.0000 mg | ORAL_TABLET | Freq: Once | ORAL | Status: AC
  Administered 2018-02-23: 09:00:00 10 mg via ORAL
  Filled 2018-02-23: qty 1

## 2018-02-23 NOTE — ED Notes (Signed)
Patient transported to X-ray 

## 2018-02-23 NOTE — Discharge Instructions (Addendum)
You can use the albuterol inhaler, 2 puffs every four to six hours as needed for wheezing/cough.  Continue taking the mucinex.  Remember to continue your home blood pressure medications.  Follow up with your doctor in the next week for recheck.  Your platelet count was low today - please follow up with your doctor for more evaluation.

## 2018-02-23 NOTE — ED Triage Notes (Signed)
Per pt he was seen here a week ago and sent home on Musinex, pt states he is not getting any better and he will like to be check again.

## 2018-02-23 NOTE — ED Provider Notes (Signed)
[facility] EMERGENCY DEPARTMENT Provider Note   CSN: [id number] Arrival date & time: 02/23/18  4098     History   Chief Complaint Chief Complaint  Patient presents with  . Nasal Congestion    HPI Jerry Small is a 57 y.o. male.  The history is provided by the patient. No language interpreter was used.   Jerry Small is a 57 y.o. male who presents to the Emergency Department complaining of cough. He reports two weeks of cough productive of white sputum. He was seen in the emergency department a week ago and treated with Mucinex. He states that his cough has progressed and is not improving despite treatment. He reports subjective fevers for the last few days but has not checked an oral temperature. No reports of hemoptysis. He does have some mild runny nose. No sore throat. He has not been taking his blood pressure medication since taking the Mucinex. He does report some right sided chest pain that he associates with coughing. He denies any abdominal pain, vomiting, diarrhea, leg swelling or pain. He has a history of hypertension. He drinks alcohol daily. He denies any tobacco use. No history of heart disease, blood clots or lung disease. Past Medical History:  Diagnosis Date  . Alcohol abuse   . Diabetes mellitus without complication ([facility])   . High cholesterol   . Hypertension   . Seizure [facility])     Patient Active Problem List   Diagnosis Date Noted  . Right knee pain 10/20/2016  . Left knee pain 11/15/2015  . Type 2 diabetes mellitus (H[facility]) 03/24/2015  . BP (high blood pressure) 03/24/2015  . Seizure ([facility]) 03/24/2015  . Alcohol dependence ([facility]) 03/25/2012    Past Surgical History:  Procedure Laterality Date  . CHOLECYSTECTOMY          Home Medications    Prior to Admission medications   Medication Sig Start Date End Date Taking? Authorizing Provider  cetirizine (ZYRTEC ALLERGY) 10 MG tablet Take 1 tablet (10 mg total) by mouth daily. 11/06/15   Everlene Farrier, PA-C  cyclobenzaprine (FLEXERIL) 10 MG tablet Take 1 tablet (10 mg total) by mouth 2 (two) times daily as needed for muscle spasms. 01/11/17   Linwood Dibbles, MD  diclofenac (VOLTAREN) 75 MG EC tablet Take 1 tablet (75 mg total) by mouth 2 (two) times daily. 10/19/16   Hudnall, Azucena Fallen, MD  doxycycline (VIBRAMYCIN) 100 MG capsule Take 1 capsule (100 mg total) by mouth 2 (two) times daily. 02/23/18   Tilden Fossa, MD  fluticasone [facility]) 50 MCG/ACT nasal spray Place 2 sprays into both nostrils daily. 11/06/15   Everlene Farrier, PA-C  furosemide (LASIX) 20 MG tablet Take 20 mg by mouth daily.    [provider]  glipiZIDE (GLUCOTROL XL) 5 MG 24 hr tablet Take 5 mg by mouth daily. 10/30/15   [provider]  HYDROcodone-acetaminophen (NORCO) 5-325 MG tablet Take 1 tablet by mouth every 6 (six) hours as needed for moderate pain. 11/13/15   Hudnall, Azucena Fallen, MD  ibuprofen (ADVIL,MOTRIN) 400 MG tablet Take 1 tablet (400 mg total) by mouth every 6 (six) hours as needed. 01/11/17   Linwood Dibbles, MD  lisinopril (PRINIVIL,ZESTRIL) 40 MG tablet Take 1 tablet (40 mg total) by mouth daily. 11/02/17   Little, Ambrose Finland, MD  omeprazole (PRILOSEC) 20 MG capsule Take 1 capsule (20 mg total) by mouth daily. 11/02/17   Little, Ambrose Finland, MD  oxybutynin (DITROPAN) 5 MG tablet Take 5 mg  by mouth 2 (two) times daily.    [provider]  phenytoin (DILANTIN) 100 MG ER capsule Take 300 mg by mouth 2 (two) times daily. To prevent seizures 03/28/12   Verne Spurr T, PA-C  pravastatin (PRAVACHOL) 10 MG tablet Take 1 tablet (10 mg total) by mouth daily. 11/02/17   Little, Ambrose Finland, MD  promethazine (PHENERGAN) 25 MG tablet Take 1 tablet (25 mg total) by mouth every 8 (eight) hours as needed for nausea or vomiting. 11/02/17   Little, Ambrose Finland, MD  sitaGLIPtin (JANUVIA) 50 MG tablet Take 50 mg by mouth daily.    [provider]  traZODone (DESYREL) 100 MG tablet Take 100 mg by  mouth at bedtime.    [provider]  Vitamin D, Ergocalciferol, (DRISDOL) 50000 units CAPS capsule Take 50,000 Units by mouth every 7 (seven) days.    [provider]    Family History Family History  Problem Relation Age of Onset  . Hypertension Father   . Seizures Father     Social History Social History   Tobacco Use  . Smoking status: Never Smoker  . Smokeless tobacco: Never Used  Substance Use Topics  . Alcohol use: Yes    Alcohol/week: 0.0 oz  . Drug use: No     Allergies   Patient has no known allergies.   Review of Systems Review of Systems  All other systems reviewed and are negative.    Physical Exam Updated Vital Signs BP (!) 178/96   Pulse 68   Temp 98.4 F (36.9 C) (Oral)   Resp 16   Ht 5\' 3"  (1.6 m)   Wt 55.8 kg (123 lb)   SpO2 99%   BMI 21.79 kg/m   Physical Exam  Constitutional: He is oriented to person, place, and time. He appears well-developed and well-nourished.  HENT:  Head: Normocephalic and atraumatic.  Cardiovascular: Normal rate and regular rhythm.  No murmur heard. Pulmonary/Chest: Effort normal. No respiratory distress.  scattered rhonchi  Abdominal: Soft. There is no tenderness. There is no rebound and no guarding.  Musculoskeletal: He exhibits no edema or tenderness.  Neurological: He is alert and oriented to person, place, and time.  Skin: Skin is warm and dry.  Psychiatric: He has a normal mood and affect. His behavior is normal.  Nursing note and vitals reviewed.    ED Treatments / Results  Labs (all labs ordered are listed, but only abnormal results are displayed) Labs Reviewed  BASIC METABOLIC PANEL - Abnormal; Notable for the following components:      Result Value   Chloride 96 (*)    Glucose, Bld 189 (*)    Calcium 8.4 (*)    All other components within normal limits  CBC WITH DIFFERENTIAL/PLATELET - Abnormal; Notable for the following components:   RBC 3.67 (*)    HCT 35.7 (*)    MCH  35.4 (*)    MCHC 36.4 (*)    Platelets 82 (*)    All other components within normal limits  TROPONIN I  D-DIMER, QUANTITATIVE (NOT AT [facility])    EKG EKG Interpretation  Date/Time:  Wednesday February 23 2018 06:38:31 EDT Ventricular Rate:  58 PR Interval:    QRS Duration: 88 QT Interval:  479 QTC Calculation: 471 R Axis:   20 Text Interpretation:  Sinus rhythm Anteroseptal infarct, age indeterminate Baseline wander in lead(s) II III aVF Confirmed by Nicanor Alcon, April ([id number]) on 02/23/2018 6:41:16 AM Also confirmed by Nicanor Alcon, April ([id number]), editor  Barbette HairCassel, Kerry ([id number]  on 02/23/2018 6:57:09 AM   Radiology Dg Chest 2 View  Result Date: 02/23/2018 CLINICAL DATA:  Seen 1 wk ago and prescribed mucinex w/o any relief, shortness of breath, difficulty breathing, right chest pain, non-smoker EXAM: CHEST - 2 VIEW COMPARISON:  02/17/2018 FINDINGS: Normal heart size and pulmonary vascularity. No focal airspace disease or consolidation in the lungs. No blunting of costophrenic angles. No pneumothorax. Mediastinal contours appear intact. Calcified granuloma in the right upper lung. Surgical clips in the right upper quadrant. Mild anterior wedging of T12. IMPRESSION: No evidence of active pulmonary disease. Electronically Signed   By: Burman NievesWilliam  Stevens M.D.   On: 02/23/2018 06:57    Procedures Procedures (including critical care time)  Medications Ordered in ED Medications  ipratropium-albuterol (DUONEB) 0.5-2.5 (3) MG/3ML nebulizer solution 3 mL (3 mLs Nebulization Given 02/23/18 0720)  albuterol (PROVENTIL HFA;VENTOLIN HFA) 108 (90 Base) MCG/ACT inhaler 2 puff (2 puffs Inhalation Given 02/23/18 0857)  dexamethasone (DECADRON) tablet 10 mg (10 mg Oral Given 02/23/18 0856)     Initial Impression / Assessment and Plan / ED Course  I have reviewed the triage vital signs and the nursing notes.  Pertinent labs & imaging results that were available during my care of the patient were reviewed by me and considered  in my medical decision making (see chart for details).     Patient here for evaluation of cough. He is non-toxic appearing on examination with no respiratory distress. He does have rhonchi on initial examination, improved after albuterol treatment. Chest x-ray with no evidence of pneumonia. Presentation is not consistent with ACS, PE, acute CHF. Given reports of subjective fevers for the last few days will treat for possible bacterial bronchitis with antibiotics, albuterol treatment and one-time dose of Decadron. Counseled patient on home care as well as outpatient follow-up and return precautions.  Old records reviewed. Current labs today demonstrate thrombocytopenia, slightly worse when compared to prior. No evidence of active bleeding. Presentation is not consistent with DIC, ITP. Discussed with patient findings of thrombocytopenia, recommend decreasing his alcohol use and following up with his family doctor for repeat evaluation.  Final Clinical Impressions(s) / ED Diagnoses   Final diagnoses:  Acute bronchitis, unspecified organism    ED Discharge Orders        Ordered    doxycycline (VIBRAMYCIN) 100 MG capsule  2 times daily     02/23/18 0848       Tilden Fossaees, Makenzie Vittorio, MD 02/23/18 1523

## 2018-07-16 IMAGING — CR DG LUMBAR SPINE COMPLETE 4+V
5 series · 5 of 5 positions shown · non-contrast
Comparison: Chest radiographs [DATE]. Abdominopelvic CT
11/24/2012. Lumbar spine radiographs 07/28/2012.

CLINICAL DATA: Low back pain for 3 days. No known injury or
radicular symptoms.

EXAM:
LUMBAR SPINE - COMPLETE 4+ VIEW

[t l-spine a.p.]
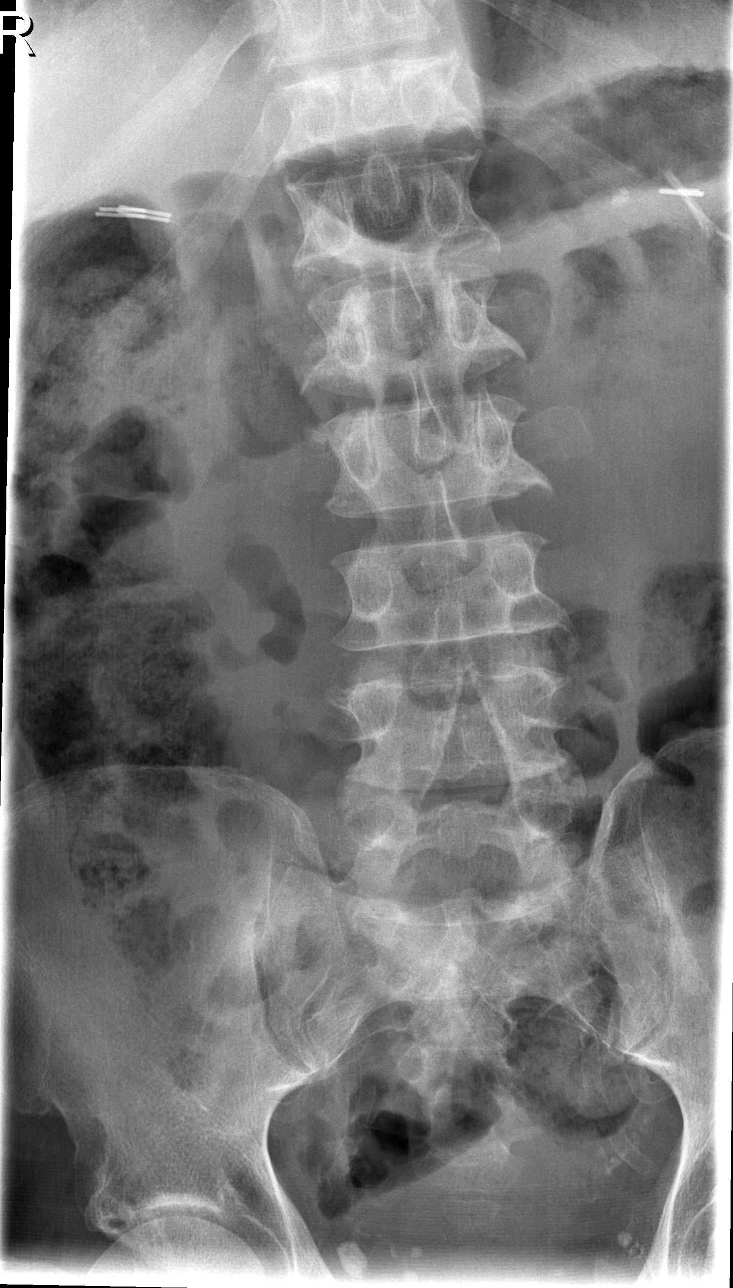

[t l-spine oblique exposure (1 of 2)]
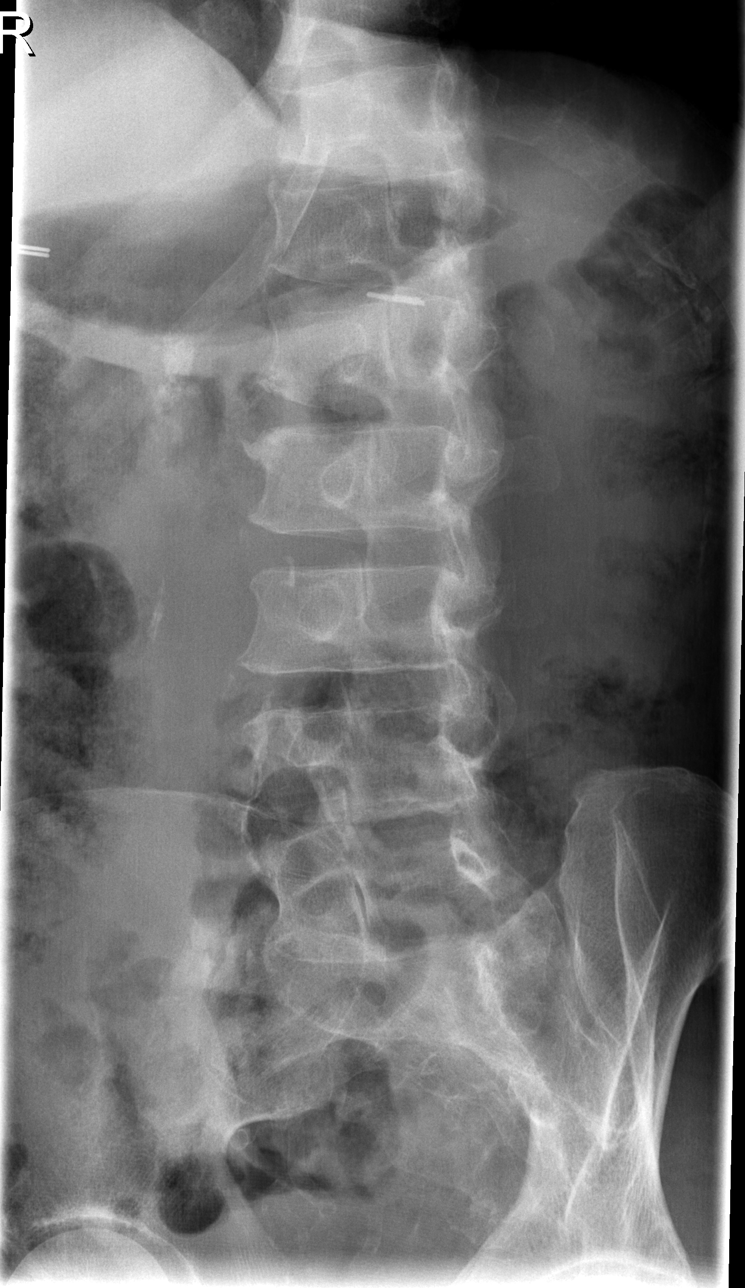

[t l-spine oblique exposure (2 of 2)]
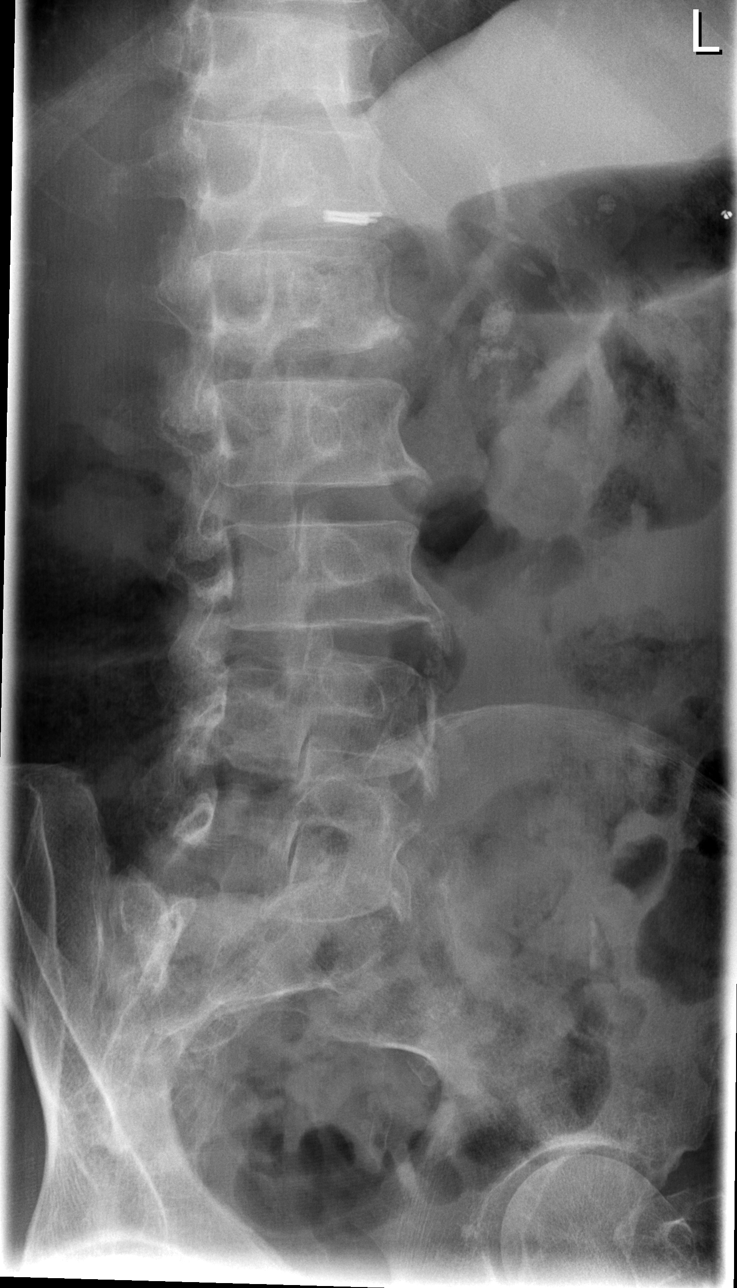

[t l-spine lat]
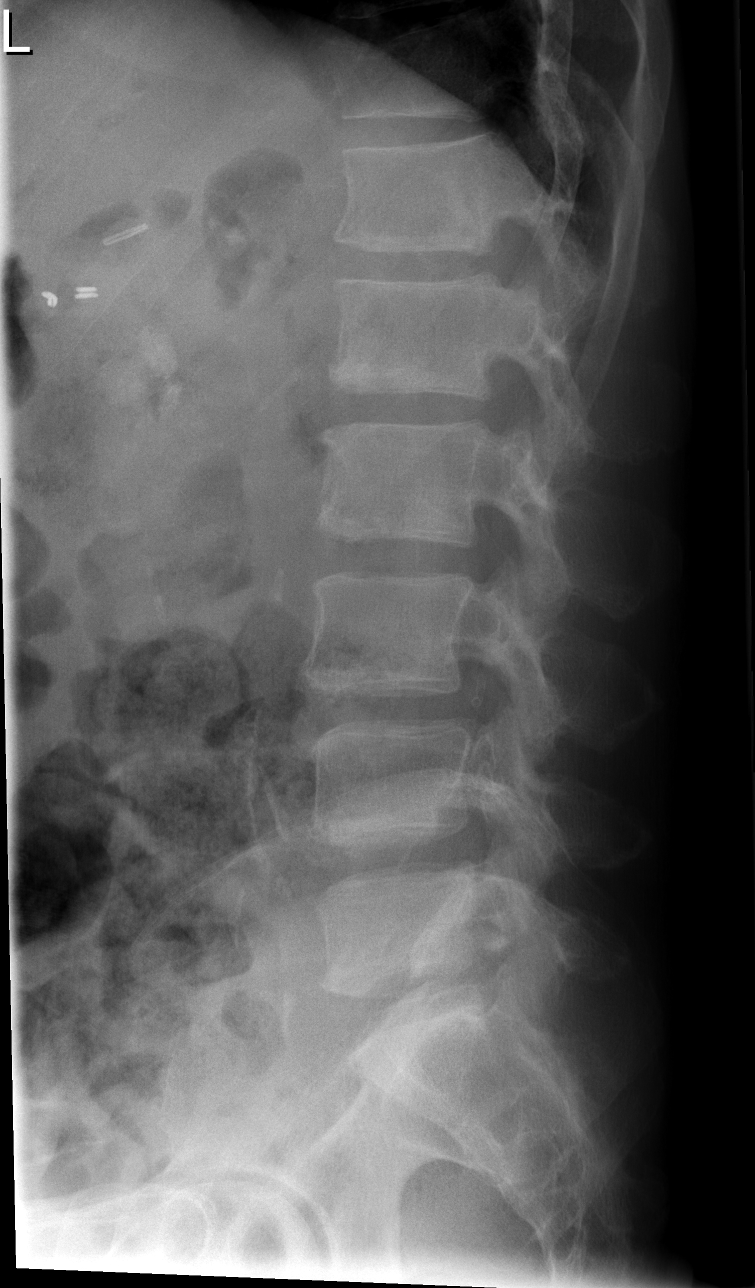

[t l-spine l5-s1 spot]
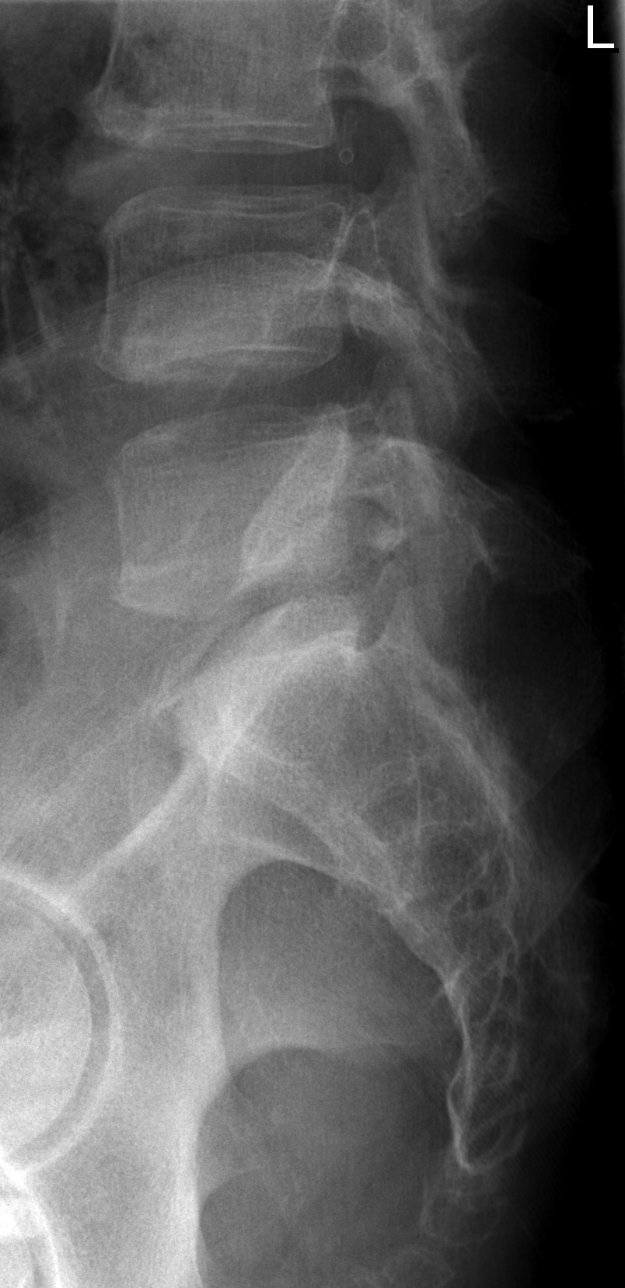

[5 of 5 positions shown; findings below may reference images not displayed]

FINDINGS: Chest radiographs demonstrate 12 rib-bearing thoracic type vertebral
bodies. There is transitional lumbosacral anatomy with a nearly
fully lumbarized S1 segment. This numbering differs from the
previous lumbar spine radiographic series. The alignment is stable
with a grade 1 anterolisthesis at L4-5. Mild chronic compression
deformity at T12 appears unchanged. No evidence of acute fracture or
pars defect. There is stable mild disc space narrowing at S1-2 and
mild facet hypertrophy. Aortoiliac atherosclerosis noted.
IMPRESSION: No acute findings. Stable spondylosis. Transitional lumbosacral
anatomy noted with a lumbarized S1 segment.

## 2019-05-25 DEATH — deceased

## 2019-08-28 IMAGING — DX DG CHEST 2V
2 series · 2 of 2 positions shown · non-contrast
Comparison: 02/17/2018

CLINICAL DATA: Seen 1 wk ago and prescribed mucinex w/o any relief,
shortness of breath, difficulty breathing, right chest pain,
non-smoker

EXAM:
CHEST - 2 VIEW

[chest pa]
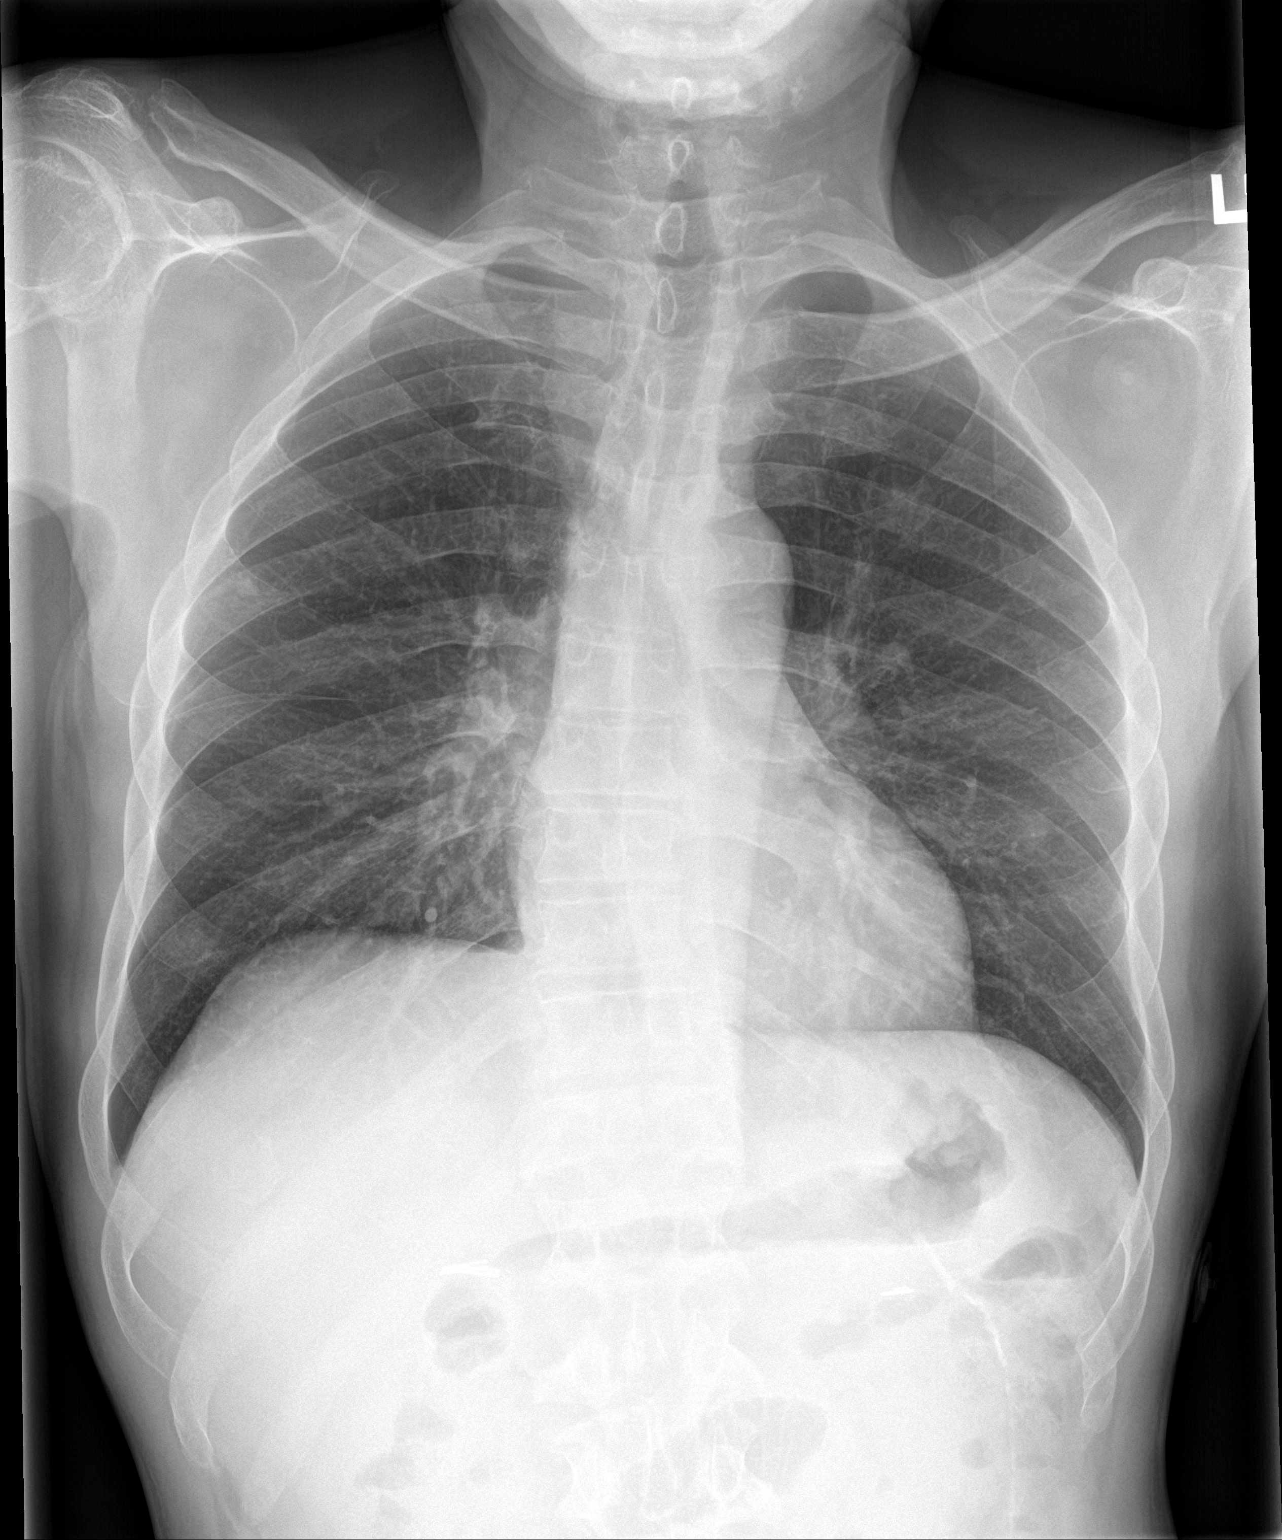

[chest lat]
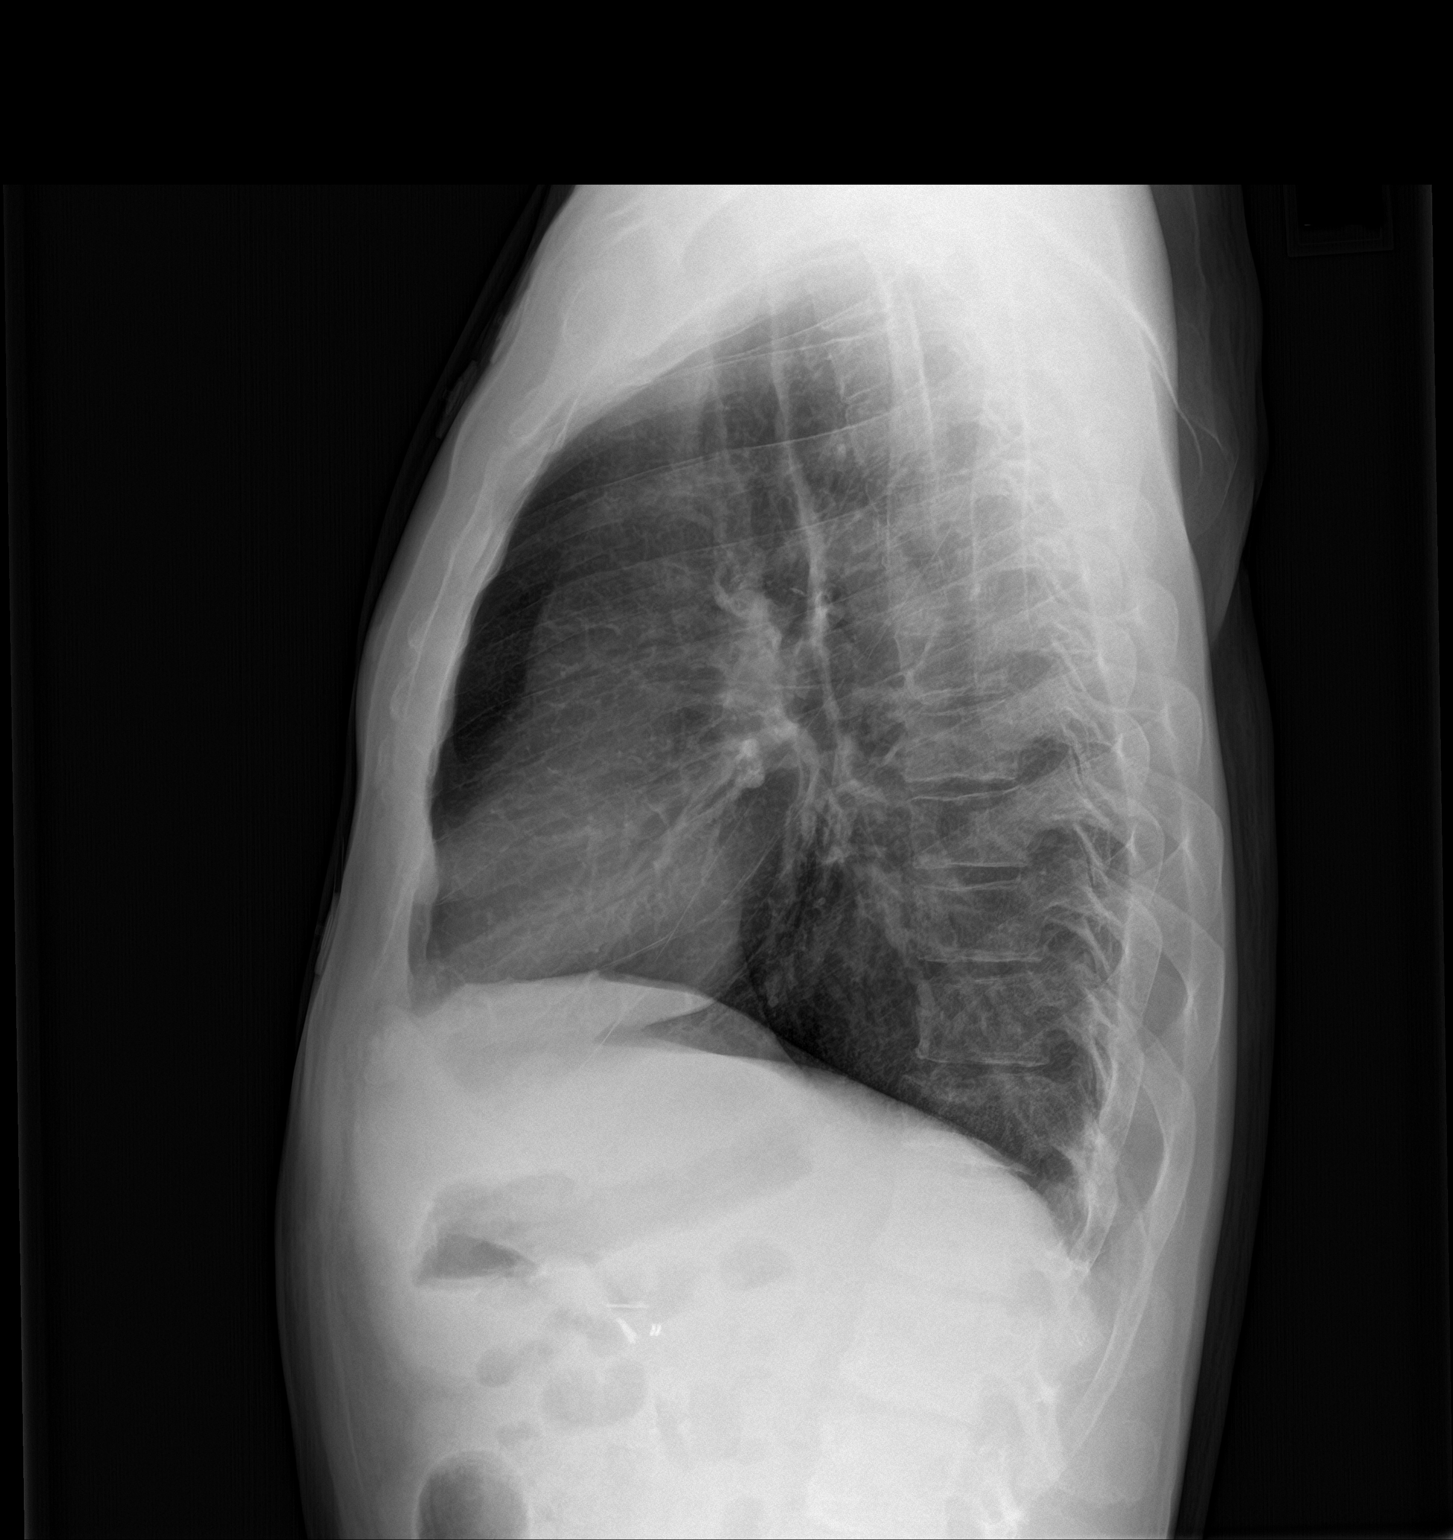

[2 of 2 positions shown; findings below may reference images not displayed]

FINDINGS: Normal heart size and pulmonary vascularity. No focal airspace
disease or consolidation in the lungs. No blunting of costophrenic
angles. No pneumothorax. Mediastinal contours appear intact.
Calcified granuloma in the right upper lung. Surgical clips in the
right upper quadrant. Mild anterior wedging of T12.
IMPRESSION: No evidence of active pulmonary disease.
